# Patient Record
Sex: Male | Born: 1965
Health system: Southern US, Community
[De-identification: ages and names within clinical notes are randomized; demographics above are authoritative.]

## PROBLEM LIST (undated history)

## (undated) DIAGNOSIS — G4733 Obstructive sleep apnea (adult) (pediatric): Secondary | ICD-10-CM

## (undated) DIAGNOSIS — I10 Essential (primary) hypertension: Secondary | ICD-10-CM

## (undated) DIAGNOSIS — R12 Heartburn: Secondary | ICD-10-CM

## (undated) DIAGNOSIS — I2510 Atherosclerotic heart disease of native coronary artery without angina pectoris: Secondary | ICD-10-CM

## (undated) HISTORY — PX: OTHER SURGICAL HISTORY: SHX169

## (undated) HISTORY — DX: Obstructive sleep apnea (adult) (pediatric): G47.33

## (undated) HISTORY — DX: Atherosclerotic heart disease of native coronary artery without angina pectoris: I25.10

## (undated) HISTORY — DX: Heartburn: R12

---

## 2007-09-18 ENCOUNTER — Emergency Department: Payer: Self-pay | Admitting: Emergency Medicine

## 2008-05-02 ENCOUNTER — Ambulatory Visit: Payer: Self-pay | Admitting: Internal Medicine

## 2015-05-01 ENCOUNTER — Encounter: Payer: Self-pay | Admitting: Family Medicine

## 2015-05-01 ENCOUNTER — Ambulatory Visit (INDEPENDENT_AMBULATORY_CARE_PROVIDER_SITE_OTHER): Payer: BLUE CROSS/BLUE SHIELD | Admitting: Family Medicine

## 2015-05-01 VITALS — BP 120/83 | HR 80 | Temp 98.1°F | Resp 16 | Ht 74.0 in | Wt 223.2 lb

## 2015-05-01 DIAGNOSIS — R5382 Chronic fatigue, unspecified: Secondary | ICD-10-CM

## 2015-05-01 DIAGNOSIS — Z0000 Encounter for general adult medical examination without abnormal findings: Secondary | ICD-10-CM | POA: Diagnosis not present

## 2015-05-01 NOTE — Progress Notes (Signed)
Name: Howard Smith   MRN: [id number]    DOB: [birth year]   Date:05/01/2015       Progress Note  Subjective  Chief Complaint  Chief Complaint  Patient presents with  . Annual Exam    HPI Here for annual physical exam.  Has not been seen here in > 2 yrs. He had taken Testosterone in past, but none x > 1 yr.  Does not feel any worse off of it.  Still c/o some fatigue.  No problem-specific assessment & plan notes found for this encounter.   Past Medical History  Diagnosis Date  . Heartburn     Past Surgical History  Procedure Laterality Date  . None      Family History  Problem Relation Age of Onset  . Heart disease Father   . Cancer - Lung Father   . Bipolar disorder Mother     Social History   Social History  . Marital Status: Married    Spouse Name: N/A  . Number of Children: N/A  . Years of Education: N/A   Occupational History  . Not on file.   Social History Main Topics  . Smoking status: Never Smoker   . Smokeless tobacco: Never Used  . Alcohol Use: No  . Drug Use: No  . Sexual Activity: Not on file   Other Topics Concern  . Not on file   Social History Narrative    No current outpatient prescriptions on file.  No Known Allergies   Review of Systems  Constitutional: Positive for malaise/fatigue. Negative for fever, chills and weight loss.  HENT: Positive for hearing loss. Negative for congestion and ear pain.   Eyes: Negative for blurred vision and double vision.  Respiratory: Negative for cough, sputum production, shortness of breath and wheezing.   Cardiovascular: Negative for chest pain, palpitations, orthopnea and leg swelling.  Gastrointestinal: Negative for heartburn, abdominal pain and blood in stool.  Genitourinary: Negative for dysuria, urgency and frequency.  Musculoskeletal: Positive for joint pain (knees and ankles). Negative for myalgias.  Skin: Negative for rash.  Neurological: Negative for dizziness, tremors, sensory change,  focal weakness, weakness and headaches.  Psychiatric/Behavioral: Negative for depression. The patient is not nervous/anxious and does not have insomnia.       Objective  Filed Vitals:   05/01/15 1356  BP: 120/83  Pulse: 80  Temp: 98.1 F (36.7 C)  TempSrc: Oral  Resp: 16  Height:  (1.88 m)  Weight: 223 lb 3.2 oz (101.243 kg)    Physical Exam  Constitutional: He is well-developed, well-nourished, and in no distress. No distress.  HENT:  Head: Normocephalic and atraumatic.  Right Ear: External ear normal.  Left Ear: External ear normal.  Mouth/Throat: Oropharynx is clear and moist.  Eyes: Conjunctivae and EOM are normal. Pupils are equal, round, and reactive to light. Right eye exhibits no discharge. Left eye exhibits no discharge. No scleral icterus.  Fundoscopic exam:      The right eye shows no arteriolar narrowing, no AV nicking, no exudate and no hemorrhage.       The left eye shows no arteriolar narrowing, no AV nicking, no exudate and no hemorrhage.  Neck: Normal range of motion. Neck supple. Carotid bruit is not present. No thyromegaly present.  Cardiovascular: Normal rate, regular rhythm, normal heart sounds and intact distal pulses.  Exam reveals no gallop and no friction rub.   No murmur heard. Pulmonary/Chest: Effort normal and breath sounds normal. No respiratory distress.  He has no wheezes. He has no rales. He exhibits no tenderness.  Abdominal: Soft. Bowel sounds are normal. He exhibits no distension, no abdominal bruit and no mass. There is no tenderness.  Genitourinary: Prostate normal, testes/scrotum normal and penis normal.  Musculoskeletal: Normal range of motion. He exhibits no edema or tenderness.  Lymphadenopathy:    He has no cervical adenopathy.  Neurological: He is alert. No cranial nerve deficit. Gait normal. Coordination normal.  Skin: Skin is warm and dry.  Psychiatric: Mood, memory, affect and judgment normal.  Vitals reviewed.     No  results found for this or any previous visit (from the past 2160 hour(s)).   Assessment & Plan  Problem List Items Addressed This Visit    None    Visit Diagnoses    Annual physical exam    -  Primary    Relevant Orders    Lipid Profile    Chronic fatigue        Relevant Orders    Comprehensive Metabolic Panel (CMET)    CBC with Differential    Vitamin D 1,25 dihydroxy    TSH       No orders of the defined types were placed in this encounter.   1. Annual physical exam  - Lipid Profile  2. Chronic fatigue  - Comprehensive Metabolic Panel (CMET) - CBC with Differential - Vitamin D 1,25 dihydroxy - TSH

## 2015-05-01 NOTE — Patient Instructions (Signed)
To get flu shot at work later this month.

## 2015-05-02 LAB — CBC WITH DIFFERENTIAL/PLATELET
BASOS: 0 %
Basophils Absolute: 0 10*3/uL (ref 0.0–0.2)
EOS (ABSOLUTE): 0.1 10*3/uL (ref 0.0–0.4)
EOS: 2 %
HEMATOCRIT: 43.9 % (ref 37.5–51.0)
HEMOGLOBIN: 15.7 g/dL (ref 12.6–17.7)
IMMATURE GRANS (ABS): 0 10*3/uL (ref 0.0–0.1)
IMMATURE GRANULOCYTES: 0 %
LYMPHS: 40 %
Lymphocytes Absolute: 2.2 10*3/uL (ref 0.7–3.1)
MCH: 29.9 pg (ref 26.6–33.0)
MCHC: 35.8 g/dL — ABNORMAL HIGH (ref 31.5–35.7)
MCV: 84 fL (ref 79–97)
MONOCYTES: 6 %
Monocytes Absolute: 0.3 10*3/uL (ref 0.1–0.9)
NEUTROS ABS: 2.8 10*3/uL (ref 1.4–7.0)
Neutrophils: 52 %
Platelets: 227 10*3/uL (ref 150–379)
RBC: 5.25 x10E6/uL (ref 4.14–5.80)
RDW: 13.5 % (ref 12.3–15.4)
WBC: 5.5 10*3/uL (ref 3.4–10.8)

## 2015-05-03 LAB — LIPID PANEL
CHOLESTEROL TOTAL: 168 mg/dL (ref 100–199)
Chol/HDL Ratio: 3.9 ratio units (ref 0.0–5.0)
HDL: 43 mg/dL (ref >39)
LDL Calculated: 110 mg/dL — ABNORMAL HIGH (ref 0–99)
Triglycerides: 74 mg/dL (ref 0–149)
VLDL CHOLESTEROL CAL: 15 mg/dL (ref 5–40)

## 2015-05-03 LAB — COMPREHENSIVE METABOLIC PANEL
ALBUMIN: 4.4 g/dL (ref 3.5–5.5)
ALK PHOS: 86 IU/L (ref 39–117)
ALT: 30 IU/L (ref 0–44)
AST: 23 IU/L (ref 0–40)
Albumin/Globulin Ratio: 1.7 (ref 1.1–2.5)
BILIRUBIN TOTAL: 0.5 mg/dL (ref 0.0–1.2)
BUN / CREAT RATIO: 15 (ref 9–20)
BUN: 15 mg/dL (ref 6–24)
CHLORIDE: 104 mmol/L (ref 97–108)
CO2: 22 mmol/L (ref 18–29)
Calcium: 9.6 mg/dL (ref 8.7–10.2)
Creatinine, Ser: 1.02 mg/dL (ref 0.76–1.27)
GFR calc Af Amer: 101 mL/min/{1.73_m2} (ref >59)
GFR calc non Af Amer: 87 mL/min/{1.73_m2} (ref >59)
GLUCOSE: 92 mg/dL (ref 65–99)
Globulin, Total: 2.6 g/dL (ref 1.5–4.5)
POTASSIUM: 4.4 mmol/L (ref 3.5–5.2)
Sodium: 139 mmol/L (ref 134–144)
Total Protein: 7 g/dL (ref 6.0–8.5)

## 2015-05-03 LAB — TSH: TSH: 1.23 u[IU]/mL (ref 0.450–4.500)

## 2015-05-06 LAB — VITAMIN D 1,25 DIHYDROXY
VITAMIN D 1, 25 (OH) TOTAL: 59 pg/mL
Vitamin D2 1, 25 (OH)2: <10 pg/mL
Vitamin D3 1, 25 (OH)2: 58 pg/mL

## 2015-05-07 NOTE — Progress Notes (Signed)
MAILED

## 2015-10-09 ENCOUNTER — Ambulatory Visit (INDEPENDENT_AMBULATORY_CARE_PROVIDER_SITE_OTHER): Payer: BLUE CROSS/BLUE SHIELD | Admitting: Family Medicine

## 2015-10-09 ENCOUNTER — Other Ambulatory Visit: Payer: Self-pay

## 2015-10-09 ENCOUNTER — Encounter: Payer: Self-pay | Admitting: Family Medicine

## 2015-10-09 VITALS — BP 113/70 | HR 96 | Resp 16 | Ht 73.0 in | Wt 227.4 lb

## 2015-10-09 DIAGNOSIS — N451 Epididymitis: Secondary | ICD-10-CM

## 2015-10-09 DIAGNOSIS — N410 Acute prostatitis: Secondary | ICD-10-CM | POA: Diagnosis not present

## 2015-10-09 MED ORDER — CIPROFLOXACIN HCL 500 MG PO TABS: 500.0000 mg | ORAL_TABLET | Freq: Two times a day (BID) | ORAL | Status: AC

## 2015-10-09 NOTE — Progress Notes (Signed)
Name: Howard Smith   MRN: [id number]    DOB: 09-12-1965   Date:10/09/2015       Progress Note  Subjective  Chief Complaint  Chief Complaint  Patient presents with  . Fatigue    very weak not sleeping -6 month  . Urinary Frequency    wakes up every 5 min to use bathroom all night -6 months    HPI C/o urinary sx. Of frequency,, urgency, nocturia (x2-3).  Sx. Present x 4-5 months.  Having some ED probvems for past 3 months.  No dysuria or penile discharge.  No problem-specific assessment & plan notes found for this encounter.   Past Medical History  Diagnosis Date  . Heartburn     Past Surgical History  Procedure Laterality Date  . None      Family History  Problem Relation Age of Onset  . Heart disease Father   . Cancer - Lung Father   . Bipolar disorder Mother     Social History   Social History  . Marital Status: Married    Spouse Name: N/A  . Number of Children: N/A  . Years of Education: N/A   Occupational History  . Not on file.   Social History Main Topics  . Smoking status: Never Smoker   . Smokeless tobacco: Never Used  . Alcohol Use: No  . Drug Use: No  . Sexual Activity: Not on file   Other Topics Concern  . Not on file   Social History Narrative     Current outpatient prescriptions:  .  ciprofloxacin (CIPRO) 500 MG tablet, Take 1 tablet (500 mg total) by mouth 2 (two) times daily., Disp: 28 tablet, Rfl: 0  No Known Allergies   Review of Systems  Constitutional: Positive for malaise/fatigue. Negative for fever, chills and weight loss.  HENT: Negative for hearing loss.   Eyes: Negative for blurred vision and double vision.  Respiratory: Negative for cough, shortness of breath and wheezing.   Cardiovascular: Negative for chest pain, palpitations and orthopnea.  Gastrointestinal: Negative for heartburn, abdominal pain and blood in stool.  Genitourinary: Positive for urgency and frequency. Negative for dysuria.  Musculoskeletal: Negative  for myalgias.  Skin: Negative for rash.  Neurological: Negative for weakness and headaches.      Objective  Filed Vitals:   10/09/15 1613  BP: 113/70  Pulse: 96  Resp: 16  Height:  (1.854 m)  Weight: 227 lb 6.4 oz (103.148 kg)    Physical Exam  Constitutional: He is oriented to person, place, and time and well-developed, well-nourished, and in no distress. No distress.  HENT:  Head: Normocephalic and atraumatic.  Abdominal: Soft. Bowel sounds are normal. He exhibits no distension and no mass. There is no tenderness.  Genitourinary: Penis normal.  R epididymitis is sl. Tender.   Prostate is soft and boggy and moderately tender  Neurological: He is alert and oriented to person, place, and time.  Vitals reviewed.      No results found for this or any previous visit (from the past 2160 hour(s)).   Assessment & Plan  Problem List Items Addressed This Visit    None    Visit Diagnoses    Prostatitis, acute    -  Primary    Relevant Medications    ciprofloxacin (CIPRO) 500 MG tablet    Epididymitis, right        Relevant Medications    ciprofloxacin (CIPRO) 500 MG tablet  Meds ordered this encounter  Medications  . ciprofloxacin (CIPRO) 500 MG tablet    Sig: Take 1 tablet (500 mg total) by mouth 2 (two) times daily.    Dispense:  28 tablet    Refill:  0   1. Prostatitis, acute  - ciprofloxacin (CIPRO) 500 MG tablet; Take 1 tablet (500 mg total) by mouth 2 (two) times daily.  Dispense: 28 tablet; Refill: 0  2. Epididymitis, right  - ciprofloxacin (CIPRO) 500 MG tablet; Take 1 tablet (500 mg total) by mouth 2 (two) times daily.  Dispense: 28 tablet; Refill: 0

## 2016-03-04 ENCOUNTER — Emergency Department: Payer: BLUE CROSS/BLUE SHIELD

## 2016-03-04 ENCOUNTER — Emergency Department
Admission: EM | Admit: 2016-03-04 | Discharge: 2016-03-04 | Disposition: A | Discharge status: Home / Self Care | Payer: BLUE CROSS/BLUE SHIELD | Attending: Emergency Medicine | Admitting: Emergency Medicine

## 2016-03-04 DIAGNOSIS — S161XXA Strain of muscle, fascia and tendon at neck level, initial encounter: Secondary | ICD-10-CM | POA: Diagnosis not present

## 2016-03-04 DIAGNOSIS — S8001XA Contusion of right knee, initial encounter: Secondary | ICD-10-CM | POA: Diagnosis not present

## 2016-03-04 DIAGNOSIS — M542 Cervicalgia: Secondary | ICD-10-CM | POA: Diagnosis present

## 2016-03-04 DIAGNOSIS — Y9389 Activity, other specified: Secondary | ICD-10-CM | POA: Insufficient documentation

## 2016-03-04 DIAGNOSIS — Y92410 Unspecified street and highway as the place of occurrence of the external cause: Secondary | ICD-10-CM | POA: Insufficient documentation

## 2016-03-04 DIAGNOSIS — Y999 Unspecified external cause status: Secondary | ICD-10-CM | POA: Insufficient documentation

## 2016-03-04 MED ORDER — ONDANSETRON 4 MG PO TBDP
ORAL_TABLET | ORAL | Status: AC
  Filled 2016-03-04: qty 1

## 2016-03-04 MED ORDER — CYCLOBENZAPRINE HCL 10 MG PO TABS: 10.0000 mg | ORAL_TABLET | Freq: Three times a day (TID) | ORAL | 0 refills | Status: DC | PRN

## 2016-03-04 MED ORDER — ONDANSETRON HCL 4 MG PO TABS
4.0000 mg | ORAL_TABLET | Freq: Once | ORAL | Status: AC
  Administered 2016-03-04: 4 mg via ORAL

## 2016-03-04 MED ORDER — ONDANSETRON 4 MG PO TBDP
4.0000 mg | ORAL_TABLET | Freq: Once | ORAL | Status: AC
  Administered 2016-03-04: 4 mg via ORAL

## 2016-03-04 MED ORDER — NAPROXEN 500 MG PO TABS: 500.0000 mg | ORAL_TABLET | Freq: Two times a day (BID) | ORAL | 0 refills | Status: DC
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
## 2016-06-26 ENCOUNTER — Ambulatory Visit
Admission: RE | Admit: 2016-06-26 | Discharge: 2016-06-26 | Disposition: A | Discharge status: Home / Self Care | Payer: BLUE CROSS/BLUE SHIELD | Source: Ambulatory Visit | Attending: Family Medicine | Admitting: Family Medicine

## 2016-06-26 ENCOUNTER — Encounter: Payer: Self-pay | Admitting: Family Medicine

## 2016-06-26 ENCOUNTER — Ambulatory Visit (INDEPENDENT_AMBULATORY_CARE_PROVIDER_SITE_OTHER): Payer: BLUE CROSS/BLUE SHIELD | Admitting: Family Medicine

## 2016-06-26 VITALS — BP 128/89 | HR 79 | Temp 98.3°F | Resp 16 | Ht 73.0 in | Wt 232.0 lb

## 2016-06-26 DIAGNOSIS — R10A1 Flank pain, right side: Secondary | ICD-10-CM | POA: Insufficient documentation

## 2016-06-26 DIAGNOSIS — R319 Hematuria, unspecified: Secondary | ICD-10-CM

## 2016-06-26 DIAGNOSIS — R42 Dizziness and giddiness: Secondary | ICD-10-CM | POA: Diagnosis not present

## 2016-06-26 DIAGNOSIS — R109 Unspecified abdominal pain: Secondary | ICD-10-CM

## 2016-06-26 LAB — POCT UA - MICROALBUMIN: MICROALBUMIN (UR) POC: 0 mg/L

## 2016-06-26 LAB — POCT URINALYSIS DIPSTICK
Bilirubin, UA: NEGATIVE
Glucose, UA: NEGATIVE
Ketones, UA: NEGATIVE
Leukocytes, UA: NEGATIVE
Nitrite, UA: NEGATIVE
RBC UA: NEGATIVE
SPEC GRAV UA: 1.01
UROBILINOGEN UA: 0.2
pH, UA: 5

## 2016-06-26 MED ORDER — CYCLOBENZAPRINE HCL 10 MG PO TABS: 5.0000 mg | ORAL_TABLET | Freq: Three times a day (TID) | ORAL | 0 refills | Status: DC | PRN

## 2016-06-26 MED ORDER — TAMSULOSIN HCL 0.4 MG PO CAPS: 0.4000 mg | ORAL_CAPSULE | Freq: Every day | ORAL | 0 refills | Status: DC

## 2016-06-26 NOTE — Progress Notes (Signed)
Subjective:    Patient ID: Howard Smith, male    DOB: 16-Feb-1966, 50 y.o.   MRN: [id number]  Howard Smith is a 50 y.o. 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 Today seems to be about the same and persistent. Describes pain as throbbing, severity 5/10 (constantly without relief) but if moving with standing up 8/10 with intermittent worsening with sharp pain radiating to front and light squeezing pressure on Right testicle / scrotum. No pain radiating to legs. - Tried chiropractor, massage without relief - Taking Ibuprofen 800mg  x twice a day without any relief. Tried topical biofreeze and heating pad without relief - No prior history of lumbar OA/DJD, no back surgery. No prior hernia. - No similar painful episodes - Drinks mostly soda, less water - Admits some urinary frequency and occasional darker urine with some slight orange color (sometimes clear urine) - Denies any fevers/chills, loss of control bladder/bowel incontinence or retention, unintentional wt loss, night sweats  Dizziness / Vertigo Episodes: - Reports symptoms 3 weeks ago, x 2 episodes in past 7 months. Describes acute onset symptoms while working, without known trigger. Unsure if related to activity or head position, standing up. - Associated generalized headache severe, worse with opening eyes and standing up with nausea, vomiting - Admits vertigo symptoms with  room spinning - Today without any symptoms - Neighbor had similar symptoms diagnosed with mild stroke with similar symptoms - Family history of CVA on both maternal / paternal and DM. Patient without HTN or DM. - Not taking aspirin - Denies any active symptoms, nausea, vomiting, weakness, numbness, or tingling, headache  Social History  Substance Use Topics  . Smoking status: Never Smoker  . Smokeless tobacco: Never Used  . Alcohol use No    Review of Systems Per HPI unless specifically indicated above     Objective:    BP 128/89   Pulse 79   Temp 98.3 F (36.8 C) (Oral)   Resp 16   Ht 6\' 1"  (1.854 m)   Wt 232 lb (105.2 kg)   BMI 30.61 kg/m   Wt Readings from Last 3 Encounters:  06/26/16 232 lb (105.2 kg)  03/04/16 225 lb (102.1 kg)  10/09/15 227 lb 6.4 oz (103.1 kg)    Physical Exam  Constitutional: He is oriented to person, place, and time. He appears well-developed and well-nourished. No distress.  HENT:  Head: Normocephalic and atraumatic.  Mouth/Throat: Oropharynx is clear and moist.  Eyes: Conjunctivae and EOM are normal. Pupils are equal, round, and reactive to light.  Neck: Normal range of motion. Neck supple. No thyromegaly present.  Cardiovascular: Normal rate, regular rhythm, normal heart sounds and intact distal pulses.   No murmur heard. Pulmonary/Chest: Effort normal and breath sounds normal. No respiratory distress. He has no wheezes. He has no rales.  Abdominal: Soft. Bowel sounds are normal. He exhibits no distension and  no mass. There is no tenderness.  Genitourinary:  Genitourinary Comments: Right inguinal region mild tender to touch. Slight fullness R inguinal area compared to Left, no palpable herniation on internal inguinal exam with valsalva.  Scrotum non tender, no swelling, no erythema.  Musculoskeletal: Normal range of motion. He exhibits no edema or tenderness.  +CVAT Right  Low Back Inspection: Normal appearance, no spinal deformity,  symmetrical. Palpation: No tenderness over spinous processes. Mild moderate tenderness to Right lower lumbar paraspinal muscles with some localized spasm ROM: Full active ROM forward flex / back extension, rotation L/R without discomfort Special Testing: Seated SLR negative for radicular pain bilaterally  Strength: Bilateral hip flex/ext 5/5, knee flex/ext 5/5, ankle dorsiflex/plantarflex 5/5 Neurovascular: intact distal sensation to light touch  Lymphadenopathy:    He has no cervical adenopathy.  Neurological: He is alert and oriented to person, place, and time.  Negative Dix Hallpike maneuver bilateral. Slight lightheaded upon sitting up quickly.  Distal sensation to light touch intact  Skin: Skin is warm and dry. No rash noted. He is not diaphoretic.  Psychiatric: He has a normal mood and affect. His behavior is normal.  Nursing note and vitals reviewed.  I have personally reviewed the images and radiology report from [facility] on 06/26/16. - My personal interpretation is that I do not identify any abnormal calcification that possibly represents a nephrolithiasis as cause of current abdominal flank pain.  CLINICAL DATA:  Right side abdominal pain for 2 weeks  EXAM: ABDOMEN - 1 VIEW  COMPARISON:  None.  FINDINGS: There is normal small bowel gas pattern. Moderate stool noted in right colon. Some colonic stool noted in distal sigmoid colon and rectum.  IMPRESSION: Normal small bowel gas pattern. Moderate stool noted in right colon.   Electronically Signed   By: Natasha MeadLiviu  Pop M.D.   On: 06/26/2016 15:12    I have personally reviewed the following lab results from 06/26/16.  Results for orders placed or performed in visit on 06/26/16  POCT UA - Microalbumin  Result Value Ref Range   Microalbumin Ur, POC 0 mg/L   Creatinine, POC  mg/dL   Albumin/Creatinine Ratio, Urine, POC    POCT Urinalysis Dipstick  Result Value Ref Range   Color, UA amber    Clarity, UA  cloudy    Glucose, UA negative    Bilirubin, UA negative    Ketones, UA negative    Spec Grav, UA 1.010    Blood, UA negative    pH, UA 5.0    Protein, UA trace    Urobilinogen, UA 0.2    Nitrite, UA negative    Leukocytes, UA Negative Negative      Assessment & Plan:   Problem List Items Addressed This Visit    Vertigo, intermittent    Unclear exact etiology, by history seems more vertiginous in nature but not entirely consistent with BPPV, exam with negative Dix-Hallpike less suggestive but not completely rule out a benign vertigo. No other focal signs or history to suggest TIA, but family history of this and no risk factors currently. Unlikely other serious intracranial abnormality without persistent headache or other red flags, unlikely aneurysm or malignancy.  Plan: 1. Empiric trial with Epley. Handout given with Epley maneuver TID for 1-2 weeks until resolved 2. May try OTC meclizine PRN for breakthrough symptoms 3. Return criteria, if not improved consider ENT eval for dizziness vs vestibular PT referral. If significant worsening can consider head CT imaging  Acute right flank pain - Primary    Unclear exact etiology currently, with subacute worsening R low back to flank pain radiating into scrotum without clear injury, but does active lifting at work. Not improved with conservative therapy. - Differential most likely nephrolithiasis by history and exam (+CVAT, amber urine color change possible hematuria, some frequency, radiating pain into scrotum), considered UTI vs pyelo but not acutely ill and no dysuria, also strongly considered inguinal hernia, exam is not diagnostic but does have some R inguinal fullness, possibility, also consider MSK low back muscle / groin strain, description of pain less likely  Plan: 1. Check UA and Urine culture - UA negative, without Hgb or blood, no sign of infection, will await urine culture as well to be sure rule out UTI 2. Check KUB today  in office - no nephrolithiasis seen by my review and per radiology, noted R stool burden, but patient not constipated. Called patient to notify of x-ray, lab results 3. Trial on Flomax 0.4mg  7-10 days 4. Rx Flexeril PRN muscle spasms and may help ureter spasm if actual nephrolithiasis, patient declined stronger pain medicine currently and anti nausea med 5. Improve hydration with water, limit sodas 6. Strict return precautions given, follow-up within 1 week if not improving, consider Urology evaluation vs Gen surgery if concern hernia      Relevant Medications   cyclobenzaprine (FLEXERIL) 10 MG tablet   tamsulosin (FLOMAX) 0.4 MG CAPS capsule   Other Relevant Orders   DG Abd 1 View (Completed)   POCT Urinalysis Dipstick (Completed)    Other Visit Diagnoses    Hematuria, unspecified type       Relevant Orders   DG Abd 1 View (Completed)   POCT UA - Microalbumin (Completed)   Urine culture   POCT Urinalysis Dipstick (Completed)      Meds ordered this encounter  Medications  . cyclobenzaprine (FLEXERIL) 10 MG tablet    Sig: Take 0.5-1 tablets (5-10 mg total) by mouth 3 (three) times daily as needed for muscle spasms.    Dispense:  20 tablet    Refill:  0  . tamsulosin (FLOMAX) 0.4 MG CAPS capsule    Sig: Take 1 capsule (0.4 mg total) by mouth daily.    Dispense:  10 capsule    Refill:  0      Follow up plan: Return in about 1 week (around 07/03/2016), or if symptoms worsen or fail to improve, for flank pain.   Saralyn Pilar, DO [facility] 06/27/2016, 10:40 AM

## 2016-06-26 NOTE — Patient Instructions (Signed)
Thank you for coming in to clinic today.  1. I am concerned about possible R kidney stone with the pain you are describing. Additionally this could be a muscle strain, but seems less likely. Or possibly an early hernia that radiates pain into your groin and scrotum. I do not feel a hernia, but I do fee a slight bulging in this tissue and this is possible. - Testing today for kidney stone with urine test and urine culture, possibly there is an infection - X-ray to look for a stone, this is not 100% and often misses many stones, but it is first attempt to locate one - Take flomax once daily for 7 to 10 days to help urinate and pass a possible stone  Start Cyclobenzapine (Flexeril) 10mg  tablets (muscle relaxant) - start with half (cut) to one whole pill at night for muscle relaxant - may make you sedated or sleepy (be careful driving or working on this) if tolerated you can take half to whole tab 2 to 3 times daily or every 8 hours as needed  Continue ibuprofen, and may take Tylenol as needed as well if this helps.  Stay well hydrated, more water and less soda or tea.  We will send you results through mychart or call sooner if we need to change treatment.  If pain is not improving over next 1-2 weeks, or new symptoms [facility] for [facility] of possible kidney stone or other cause, may need CT scan next. Or we can arrange you to be seen by Urologist.  If pain is worse with lifting and you feel a bulging and concern for hernia, then we can arrange follow-up with General Surgery  For Dizziness episodes, as discussed likely a vertigo, possibly from inner ear. I do not have concerns today for stroke or TIA. - You may take baby aspirin 81mg  daily for prevention if you are concerned, with family history of stroke - Try to keep track of symptoms, be aware of sudden onset with head movements, dietary triggers, positional or other factors - Maybe related to new migraines or other  causes, if headaches worsening or other changes follow-up sooner, or seek more immediate treatment  Try Epley maneuver to help prevent future problems.  Or you can try Meclizine 25mg  up to 3 times a day as needed for nausea and dizziness  Please schedule a follow-up appointment with Dr. Althea CharonKaramalegos in 1 week if not improved  If you have any other questions or concerns, please feel free to call the clinic or send a message through MyChart. You may also schedule an earlier appointment if necessary.  Saralyn PilarAlexander Hendy Brindle, DO [facility]  1. You have symptoms of Vertigo (Benign Paroxysmal Positional Vertigo) - This is commonly caused by inner ear fluid imbalance, sometimes can be worsened by allergies and sinus symptoms, otherwise it can occur randomly sometimes and we may never discover the exact cause. - To treat this, try the Epley Manuever (see diagrams/instructions below) at home up to 3 times a day for 1-2 weeks or until symptoms resolve - You may take Meclizine as needed up to 3 times a day for dizziness, this will not cure symptoms but may help. Caution may make you drowsy.  If you develop significant worsening episode with vertigo that does not improve and you get severe headache, loss of vision, arm or leg weakness, slurred speech, or other concerning symptoms please seek immediate medical attention at Emergency Department.  Please schedule a follow-up appointment with Dr Althea CharonKaramalegos within 4 weeks if Vertigo not improving, and will consider Referral to Vestibular Rehab  See the next page for images describing the Epley Manuever.     ----------------------------------------------------------------------------------------------------------------------

## 2016-06-27 NOTE — Assessment & Plan Note (Signed)
Unclear exact etiology currently, with subacute worsening R low back to flank pain radiating into scrotum without clear injury, but does active lifting at work. Not improved with conservative therapy. - Differential most likely nephrolithiasis by history and exam (+CVAT, amber urine color change possible hematuria, some frequency, radiating pain into scrotum), considered UTI vs pyelo but not acutely ill and no dysuria, also strongly considered inguinal hernia, exam is not diagnostic but does have some R inguinal fullness, possibility, also consider MSK low back muscle / groin strain, description of pain less likely  Plan: 1. Check UA and Urine culture - UA negative, without Hgb or blood, no sign of infection, will await urine culture as well to be sure rule out UTI 2. Check KUB today in office - no nephrolithiasis seen by my review and per radiology, noted R stool burden, but patient not constipated. Called patient to notify of x-ray, lab results 3. Trial on Flomax 0.4mg  7-10 days 4. Rx Flexeril PRN muscle spasms and may help ureter spasm if actual nephrolithiasis, patient declined stronger pain medicine currently and anti nausea med 5. Improve hydration with water, limit sodas 6. Strict return precautions given, follow-up within 1 week if not improving, consider Urology evaluation vs Gen surgery if concern hernia

## 2016-06-27 NOTE — Assessment & Plan Note (Addendum)
Unclear exact etiology, by history seems more vertiginous in nature but not entirely consistent with BPPV, exam with negative Dix-Hallpike less suggestive but not completely rule out a benign vertigo. No other focal signs or history to suggest TIA, but family history of this and no risk factors currently. Unlikely other serious intracranial abnormality without persistent headache or other red flags, unlikely aneurysm or malignancy.  Plan: 1. Empiric trial with Epley. Handout given with Epley maneuver TID for 1-2 weeks until resolved 2. May try OTC meclizine PRN for breakthrough symptoms 3. Return criteria, if not improved consider ENT eval for dizziness vs vestibular PT referral. If significant worsening can consider head CT imaging

## 2016-06-28 LAB — URINE CULTURE: Organism ID, Bacteria: NO GROWTH

## 2016-10-14 ENCOUNTER — Ambulatory Visit (INDEPENDENT_AMBULATORY_CARE_PROVIDER_SITE_OTHER): Payer: BLUE CROSS/BLUE SHIELD | Admitting: Family Medicine

## 2016-10-14 ENCOUNTER — Encounter: Payer: Self-pay | Admitting: Family Medicine

## 2016-10-14 VITALS — BP 128/94 | HR 86 | Temp 98.6°F | Resp 16 | Ht 73.0 in | Wt 234.0 lb

## 2016-10-14 DIAGNOSIS — R030 Elevated blood-pressure reading, without diagnosis of hypertension: Secondary | ICD-10-CM | POA: Diagnosis not present

## 2016-10-14 DIAGNOSIS — L0201 Cutaneous abscess of face: Secondary | ICD-10-CM

## 2016-10-14 DIAGNOSIS — S76311A Strain of muscle, fascia and tendon of the posterior muscle group at thigh level, right thigh, initial encounter: Secondary | ICD-10-CM | POA: Diagnosis not present

## 2016-10-14 DIAGNOSIS — S76319A Strain of muscle, fascia and tendon of the posterior muscle group at thigh level, unspecified thigh, initial encounter: Secondary | ICD-10-CM | POA: Insufficient documentation

## 2016-10-14 DIAGNOSIS — I10 Essential (primary) hypertension: Secondary | ICD-10-CM | POA: Insufficient documentation

## 2016-10-14 MED ORDER — DOXYCYCLINE HYCLATE 100 MG PO CAPS: 100.0000 mg | ORAL_CAPSULE | Freq: Two times a day (BID) | ORAL | 0 refills | Status: DC

## 2016-10-14 MED ORDER — NAPROXEN 500 MG PO TABS: 500.0000 mg | ORAL_TABLET | Freq: Two times a day (BID) | ORAL | 2 refills | Status: DC

## 2016-10-14 MED ORDER — CYCLOBENZAPRINE HCL 10 MG PO TABS: 5.0000 mg | ORAL_TABLET | Freq: Three times a day (TID) | ORAL | 1 refills | Status: DC | PRN

## 2016-10-14 MED ORDER — AMOXICILLIN-POT CLAVULANATE 875-125 MG PO TABS: 1.0000 | ORAL_TABLET | Freq: Two times a day (BID) | ORAL | 0 refills | Status: DC

## 2016-10-14 NOTE — Progress Notes (Signed)
Subjective:    Patient ID: Howard Smith, male    DOB: 21-Jan-1966, 51 y.o.   MRN: [id number]  Howard Smith is a 51 y.o. male presenting on 10/14/2016 for Hypertension (obtw facial swelling on Right side onset 2 days and pulled muscle)   HPI   Elevated BP without dx of HTN: Reports never diagnosed with HTN in past. Has had normal BP when at [employer] physical in past and other doctors visits. Does not recall even borderline elevated BP. Now here today to discuss elevated BPyesterday at DOT physical, 145/101, then repeat after laying down 142/108, would not complete due to HTN. - No known family history of HTN. Has Father with Heart Disease MI >60+ Current Meds - none, never on anti-HTN   Associated Factors: - No significant life stressors currently, has some average family stress, - Significantly did hurt his Right leg / hamstring over past weekend, and has had pain with that, taking several Ibuprofen up to 200mg  taking 5 tabs for 1000mg  per one dose. - Drinks diet mountain dew, 3-4 of 20 oz daily. Not - Trying to exercise regularly at gym in morning before work, occasionally canceled and missed days, 5 days weekly Denies CP, dyspnea, HA, edema, dizziness / lightheadedness  Facial Abscess, Right New problem, reported onset 2 days ago, he did shave but did not notice any scratch or facial injury. He noticed localized spot of redness and swelling, did drain minimal pus but mostly bleeding. He used warm compress with some relief, but now worsening swelling and pain extending down jaw. - No similar episodes of abscess before - Denies any fever, chills, sweats, nausea, vomiting  Right Leg Pain / Hamstring Injury - Reports he started playing softball this past weekend for first time in 22 years, he was running bases and when rounding corner with a turn and twist he felt acute pain behind his Right knee, but he was able to keep running did not fall or have traumatic injury. He had pain and did not play  rest of game but still able to ambulate. Denies hearing a pop or feeling any other abnormality at time of injury - He identified darker bruising behind Right knee only, but pain higher up on thigh hamstring. Over past few days he has been stretching leg. Pain is not constant but worst with activity and extension - Taking regular Ibuprofen up to 4-5 pills 200mg  per dose 2-3 times daily, minimal relief, tried Tylenol able to sleep last night - Denies knee swelling, other bruising injury, swelling, numbness, tingling, weakness, fall   Family History  Problem Relation Age of Onset  . Heart disease Father   . Cancer - Lung Father   . Bipolar disorder Mother     Social History  Substance Use Topics  . Smoking status: Never Smoker  . Smokeless tobacco: Never Used  . Alcohol use No    Review of Systems Per HPI unless specifically indicated above     Objective:    BP (!) 128/94 (BP Location: Left Arm, Cuff Size: Normal)   Pulse 86   Temp 98.6 F (37 C) (Oral)   Resp 16   Ht 6\' 1"  (1.854 m)   Wt 234 lb (106.1 kg)   BMI 30.87 kg/m   Wt Readings from Last 3 Encounters:  10/14/16 234 lb (106.1 kg)  06/26/16 232 lb (105.2 kg)  03/04/16 225 lb (102.1 kg)    Physical Exam  Constitutional: He is oriented to person, place,  and time. He appears well-developed and well-nourished. No distress.  Well-appearing, comfortable, cooperative  HENT:  Head: Normocephalic and atraumatic.  Mouth/Throat: Oropharynx is clear and moist.  Eyes: Conjunctivae are normal.  Neck: Normal range of motion. Neck supple.  Cardiovascular: Normal rate, regular rhythm, normal heart sounds and intact distal pulses.   No murmur heard. Pulmonary/Chest: Effort normal.  Musculoskeletal: Normal range of motion.  Right Knee / Hamstring Lower Extremity Inspection: Normal appearance and symmetrical. No ecchymosis or effusion. - Posteriorly above R knee with localized area of ecchymosis darker with surrounding  yellowing Palpation: Non-tender over ecchymosis posterior knee, but mild to moderate tenderness over higher R hamstring muscle bodies with some palpable hypertonicity and spasm. - R knee anteriorly non tender joint line. No crepitus ROM: Full active ROM bilaterally - some mild discomfort and pain with full R knee extension but not limited Special Testing: Lachman / Valgus/Varus tests negative with intact ligaments (ACL, MCL, LCL). McMurray negative without meniscus symptoms. Strength: 5/5 intact knee flex/ext, ankle dorsi/plantarflex Neurovascular: distally intact sensation light touch and pulses  Lymphadenopathy:    He has no cervical adenopathy.  Neurological: He is alert and oriented to person, place, and time.  Distal sensation intact  Gait normal  Skin: Skin is warm and dry. No rash noted. He is not diaphoretic. There is erythema.  Right facial cheek area with scab and localized firm induration approx 2-3 cm region surrounding. No fluctuance. No drainage. Mild to moderate tender extending over affected spot and superiorly over angle of mandible and down jaw into neck without extending erythema.  Psychiatric: His behavior is normal.  Nursing note and vitals reviewed.    Right Face    Right Leg         Assessment & Plan:   Problem List Items Addressed This Visit    Partial hamstring tear    Suspected partial hamstring tear, acute R posterior thigh hamstring pain with localized bruising posterior R knee, inciting injury 3 days ago with softball injury while running and twisting (turning corner) non traumatic injury, without audible or sensation of pop, remained ambulatory - Gradually improving, still painful, muscle spasm. Without knee or hip symptoms. - Able to bear weight without significant problem, no knee instability - No prior history of Leg injury - Not responding to conservative therapy with NSAID only  Plan: 1. Start anti-inflammatory trial with rx Naprosyn 500mg   BID wc x 2-4 weeks, then PRN 2. Start Tylenol 500-1000mg  per dose TID PRN breakthrough - Start muscle relaxant with Flexeril 10mg  tabs - take 5-10mg  up to TID PRN, titrate up as tolerated 3. RICE therapy (rest, ice, compression, elevation) for swelling, activity modification 4. Hold imaging today - most benefit likely from [facility] ultrasound in future to evaluate partial muscle tear 5. Gradual increase activity as tolerated, let pain be guide 6. Follow-up 6 weeks as planned - may need guided PT regimen      Relevant Medications   cyclobenzaprine (FLEXERIL) 10 MG tablet   naproxen (NAPROSYN) 500 MG tablet   Facial abscess - Primary    Consistent with new acute R facial cheek abscess with firm induration with some surrounding erythema but remains localized without clear cellulitis or any systemic symptoms. Concern with some extension of pain over jawline. - No history of recurrent abcesses in this or other areas, not consistent with chronic suppurativa hidradenitis. No recent antibiotics.  Plan: 1. Given location and lack of fluctuance - decision to defer I&D today 2. Start double antibiotic  coverage with Augmentin and Doxcycyline BID for 10 days - potential side effects, precautions given 3. Warm compresses regularly 4. Naproxen for leg can help as NSAID, Tylenol PRN 5. Strict return precautions given if not improving, see AVS - when to go to [facility] ED 6. Follow-up sooner 1-2 weeks as needed      Relevant Medications   amoxicillin-clavulanate (AUGMENTIN) 875-125 MG tablet   doxycycline (VIBRAMYCIN) 100 MG capsule   Elevated BP without diagnosis of hypertension    Mild elevated BP initially today, repeat manual improved SBP but still elevated DBP >90. No prior dx HTN or Pre-HTN, but at [employer] physical yesterday had persistent elevated BP >140/100 - No fam history of HTN. No prior meds  Plan: 1. Reassurance today, suspect other factors raising BP, with acute R hamstring leg injury, facial  abscess infection - treat these underlying problems first 2. Hold new dx HTN and new start anti-HTN med at this time 3. Check home BP regularly, given handout BP log - check daily 4. Follow-up 6 weeks for re-check BP, if elevated still at that time and home readings elevated, will consider new dx and new start thiazide, ccb, or ACEi         Meds ordered this encounter  Medications  . amoxicillin-clavulanate (AUGMENTIN) 875-125 MG tablet    Sig: Take 1 tablet by mouth 2 (two) times daily. For 10 days    Dispense:  20 tablet    Refill:  0  . doxycycline (VIBRAMYCIN) 100 MG capsule    Sig: Take 1 capsule (100 mg total) by mouth 2 (two) times daily. For 10 days. Take with full glass water and stay upright for 30 min after    Dispense:  20 capsule    Refill:  0  . cyclobenzaprine (FLEXERIL) 10 MG tablet    Sig: Take 0.5-1 tablets (5-10 mg total) by mouth 3 (three) times daily as needed for muscle spasms.    Dispense:  30 tablet    Refill:  1  . naproxen (NAPROSYN) 500 MG tablet    Sig: Take 1 tablet (500 mg total) by mouth 2 (two) times daily with a meal. For 2-4 weeks then as needed    Dispense:  60 tablet    Refill:  2      Follow up plan: Return in about 6 weeks (around 11/25/2016) for BP check, R Hamstring.  Saralyn Pilar, DO [facility] 10/14/2016, 9:03 AM

## 2016-10-14 NOTE — Assessment & Plan Note (Signed)
Consistent with new acute R facial cheek abscess with firm induration with some surrounding erythema but remains localized without clear cellulitis or any systemic symptoms. Concern with some extension of pain over jawline. - No history of recurrent abcesses in this or other areas, not consistent with chronic suppurativa hidradenitis. No recent antibiotics.  Plan: 1. Given location and lack of fluctuance - decision to defer I&D today 2. Start double antibiotic coverage with Augmentin and Doxcycyline BID for 10 days - potential side effects, precautions given 3. Warm compresses regularly 4. Naproxen for leg can help as NSAID, Tylenol PRN 5. Strict return precautions given if not improving, see AVS - [facility] 6. Follow-up sooner 1-2 weeks as needed

## 2016-10-14 NOTE — Assessment & Plan Note (Addendum)
Suspected partial hamstring tear, acute R posterior thigh hamstring pain with localized bruising posterior R knee, inciting injury 3 days ago with softball injury while running and twisting (turning corner) non traumatic injury, without audible or sensation of pop, remained ambulatory - Gradually improving, still painful, muscle spasm. Without knee or hip symptoms. - Able to bear weight without significant problem, no knee instability - No prior history of Leg injury - Not responding to conservative therapy with NSAID only  Plan: 1. Start anti-inflammatory trial with rx Naprosyn 500mg  BID wc x 2-4 weeks, then PRN 2. Start Tylenol 500-1000mg  per dose TID PRN breakthrough - Start muscle relaxant with Flexeril 10mg  tabs - take 5-10mg  up to TID PRN, titrate up as tolerated 3. RICE therapy (rest, ice, compression, elevation) for swelling, activity modification 4. Hold imaging today - most benefit likely from [facility] ultrasound in future to evaluate partial muscle tear 5. Gradual increase activity as tolerated, let pain be guide 6. Follow-up 6 weeks as planned - may need guided PT regimen

## 2016-10-14 NOTE — Patient Instructions (Signed)
Thank you for coming in to clinic today.  1. Facial Abscess - Start both antibiotics today, take each one twice a day with food for 10 days - Use hot compress on face few times a day help drain any potential pus - Keep close watch on facial redness is spreading, pain worsening, swelling worsening, fevers, nausea, vomiting can't take medicines > then immediately go to Hospital ED for further treatment, may need IV antibiotics or sometimes Head CT imaging to check deeper abscess  2. For Hamstring, likely partial tear -  Recommend trial of Anti-inflammatory with Naproxen (Naprosyn) 500mg  tabs - take one with food and plenty of water TWICE daily every day (breakfast and dinner), for next 2 to 4 weeks, then you may take only as needed - DO NOT TAKE any ibuprofen, aleve, motrin while you are taking this medicine - It is safe to take Tylenol Ext Str 500mg  tabs - take 1 to 2 (max dose 1000mg ) every 6 hours as needed for breakthrough pain, max 24 hour daily dose is 6 to 8 tablets or 4000mg   Start Cyclobenzapine (Flexeril) 10mg  tablets (muscle relaxant) - start with half (cut) to one whole pill at night for muscle relaxant - may make you sedated or sleepy (be careful driving or working on this) if tolerated you can take half to whole tab 2 to 3 times daily or every 8 hours as needed  Use ice packs and elevation for swelling and bruising  Keep stretching, stay active but do not over do it. Let pain be your guide  3. For elevated BP - Check regularly about once daily different times, when comfortable seated 5 min, at home, write down readings - No diagnosis yet of HTN - Likely with infection and pain, among other causes - Try to limit mountain dew caffeine  Please schedule a follow-up appointment with Dr. Althea CharonKaramalegos in 6 weeks for BP check, R Hamstring  If you have any other questions or concerns, please feel free to call the clinic or send a message through MyChart. You may also schedule an earlier  appointment if necessary.  Saralyn PilarAlexander Kemauri Musa, DO [facility]

## 2016-10-14 NOTE — Assessment & Plan Note (Signed)
Mild elevated BP initially today, repeat manual improved SBP but still elevated DBP >90. No prior dx HTN or Pre-HTN, but at [employer] physical yesterday had persistent elevated BP >140/100 - No fam history of HTN. No prior meds  Plan: 1. Reassurance today, suspect other factors raising BP, with acute R hamstring leg injury, facial abscess infection - treat these underlying problems first 2. Hold new dx HTN and new start anti-HTN med at this time 3. Check home BP regularly, given handout BP log - check daily 4. Follow-up 6 weeks for re-check BP, if elevated still at that time and home readings elevated, will consider new dx and new start thiazide, ccb, or ACEi

## 2016-10-17 ENCOUNTER — Encounter: Payer: Self-pay | Admitting: Emergency Medicine

## 2016-10-17 ENCOUNTER — Emergency Department
Admission: RE | Admit: 2016-10-17 | Discharge: 2016-10-17 | Disposition: A | Discharge status: Home / Self Care | Payer: BLUE CROSS/BLUE SHIELD | Attending: Emergency Medicine | Admitting: Emergency Medicine

## 2016-10-17 DIAGNOSIS — L0201 Cutaneous abscess of face: Secondary | ICD-10-CM | POA: Diagnosis present

## 2016-10-17 DIAGNOSIS — L03211 Cellulitis of face: Secondary | ICD-10-CM | POA: Diagnosis not present

## 2016-10-17 MED ORDER — LIDOCAINE HCL (PF) 1 % IJ SOLN
5.0000 mL | Freq: Once | INTRAMUSCULAR | Status: DC
  Filled 2016-10-17: qty 5

## 2016-10-17 NOTE — Discharge Instructions (Signed)

## 2016-10-17 NOTE — ED Provider Notes (Signed)
[facility] Emergency Department Provider Note  ____________________________________________  Time seen: Approximately 9:03 AM  I have reviewed the triage vital signs and the nursing notes.   HISTORY  Chief Complaint Abscess   HPI Howard Smith is a 51 y.o. male who presents for evaluation of a facial abscess. Patient was seen by PCP 3 days ago for similar complaint and was started onAugmentin and doxycycline which he has been taking. The redness has resolved, and the swelling has improved according to patient. patient reports that he has been using warm compresses with a lot of pus was expressed yesterday. He reports the abscess looks much better however he is concerned that there is still fluctuance and wanted it to be rechecked. No fever or chills, no nausea or vomiting.  Past Medical History:  Diagnosis Date  . Heartburn     Patient Active Problem List   Diagnosis Date Noted  . Facial abscess 10/14/2016  . Elevated BP without diagnosis of hypertension 10/14/2016  . Partial hamstring tear 10/14/2016  . Vertigo, intermittent 06/26/2016    Past Surgical History:  Procedure Laterality Date  . none      Prior to Admission medications   Medication Sig Start Date End Date Taking? Authorizing Provider  amoxicillin-clavulanate (AUGMENTIN) 875-125 MG tablet Take 1 tablet by mouth 2 (two) times daily. For 10 days 10/14/16   Smitty Cords, DO  cyclobenzaprine (FLEXERIL) 10 MG tablet Take 0.5-1 tablets (5-10 mg total) by mouth 3 (three) times daily as needed for muscle spasms. 10/14/16   Smitty Cords, DO  doxycycline (VIBRAMYCIN) 100 MG capsule Take 1 capsule (100 mg total) by mouth 2 (two) times daily. For 10 days. Take with full glass water and stay upright for 30 min after 10/14/16   Smitty Cords, DO  naproxen (NAPROSYN) 500 MG tablet Take 1 tablet (500 mg total) by mouth 2 (two) times daily with a meal. For 2-4 weeks then as  needed 10/14/16   Smitty Cords, DO  tamsulosin (FLOMAX) 0.4 MG CAPS capsule Take 1 capsule (0.4 mg total) by mouth daily. Patient not taking: Reported on 10/14/2016 06/26/16   Smitty Cords, DO    Allergies Patient has no known allergies.  Family History  Problem Relation Age of Onset  . Heart disease Father   . Cancer - Lung Father   . Bipolar disorder Mother     Social History Social History  Substance Use Topics  . Smoking status: Never Smoker  . Smokeless tobacco: Never Used  . Alcohol use No    Review of Systems  Constitutional: Negative for fever. Eyes: Negative for visual changes. ENT: Negative for sore throat. Neck: No neck pain  Cardiovascular: Negative for chest pain. Respiratory: Negative for shortness of breath. Gastrointestinal: Negative for abdominal pain, vomiting or diarrhea. Genitourinary: Negative for dysuria. Musculoskeletal: Negative for back pain. Skin: Negative for rash. + facial abscess Neurological: Negative for headaches, weakness or numbness. Psych: No SI or HI  ____________________________________________   PHYSICAL EXAM:  VITAL SIGNS: ED Triage Vitals  Enc Vitals Group     BP 10/17/16 0850 (!) 141/92     Pulse Rate 10/17/16 0850 79     Resp 10/17/16 0850 16     Temp 10/17/16 0850 97.6 F (36.4 C)     Temp Source 10/17/16 0850 Oral     SpO2 10/17/16 0850 97 %     Weight 10/17/16 0841 234 lb (106.1 kg)  Height 10/17/16 0841 6\' 1"  (1.854 m)     Head Circumference --      Peak Flow --      Pain Score --      Pain Loc --      Pain Edu? --      Excl. in GC? --     Constitutional: Alert and oriented. Well appearing and in no apparent distress. HEENT:      Head: Normocephalic and atraumatic.         Eyes: Conjunctivae are normal. Sclera is non-icteric. EOMI. PERRL      Mouth/Throat: Mucous membranes are moist.       Neck: Supple with no signs of meningismus. Cardiovascular: Regular rate and rhythm. No  murmurs, gallops, or rubs. 2+ symmetrical distal pulses are present in all extremities. No JVD. Respiratory: Normal respiratory effort. Lungs are clear to auscultation bilaterally. No wheezes, crackles, or rhonchi.  Musculoskeletal: Nontender with normal range of motion in all extremities. No edema, cyanosis, or erythema of extremities. Neurologic: Normal speech and language. Face is symmetric. Moving all extremities. No gross focal neurologic deficits are appreciated. Skin: There is a scab with no overlying erythema and mild swelling, no tenderness, with 1inch area of induration and no fluctuance Psychiatric: Mood and affect are normal. Speech and behavior are normal.  ____________________________________________   LABS (all labs ordered are listed, but only abnormal results are displayed)  Labs Reviewed - No data to display ____________________________________________  EKG  none  ____________________________________________  RADIOLOGY  none  ____________________________________________   PROCEDURES  Procedure(s) performed: None Procedures Critical Care performed:  None ____________________________________________   INITIAL IMPRESSION / ASSESSMENT AND PLAN / ED COURSE  51 y.o. male who presents for evaluation of a facial abscess. Patient was started on antibiotics 3 days ago with significant improvement and drainage of pus. Looking at the PCPs note there is a picture of for the infection looked initially and the swelling has gotten markedly down. The erythema has fully resolved. Patient has mostly a scab with no overlying erythema, no tenderness, and a small amount of induration. I believe at this time that antibiotics are working. I discussed risks and benefits of I&D with patient especially the risks of scarring in the setting of significant improvement in 3 days of antibiotics and no systemic symptoms of worsening infection. Patient agrees that he does not want it drained at  this time and will continue the antibiotics and warm compresses. I discussed return precautions of worsening infection and recommended that he comes to the emergency department if these develop. Also recommend close follow-up with PCP on Monday for recheck of his wound. Patient will continue prescribed antibiotics.    Pertinent labs & imaging results that were available during my care of the patient were reviewed by me and considered in my medical decision making (see chart for details).    ____________________________________________   FINAL CLINICAL IMPRESSION(S) / ED DIAGNOSES  Final diagnoses:  Cellulitis of face      NEW MEDICATIONS STARTED DURING THIS VISIT:  New Prescriptions   No medications on file     Note:  This document was prepared using Dragon voice recognition software and may include unintentional dictation errors.    Nita Sicklearolina Harlo Fabela, MD 10/17/16 [phone]

## 2016-10-17 NOTE — ED Notes (Signed)
MD showed pt picture of original picture of face. Redness and swelling gone down significantly. Pt instructed to keep taking antibiotics and continue with warm compresses. Pt verbalized understanding.

## 2016-10-17 NOTE — ED Triage Notes (Addendum)
Patient to ER for c/o abscess to right face. Patient has been seen by PCP and has been on oral antibiotics. States abscess has not improved, was instructed to come to ER for possible drainage of abscess and IV antibiotics.  Abscess started on Tuesday, has been on antibiotics since Wednesday afternoon.

## 2016-10-20 ENCOUNTER — Ambulatory Visit (INDEPENDENT_AMBULATORY_CARE_PROVIDER_SITE_OTHER): Payer: BLUE CROSS/BLUE SHIELD | Admitting: Family Medicine

## 2016-10-20 ENCOUNTER — Encounter: Payer: Self-pay | Admitting: Family Medicine

## 2016-10-20 VITALS — BP 136/94 | HR 74 | Temp 98.1°F | Resp 16 | Ht 73.0 in | Wt 237.0 lb

## 2016-10-20 DIAGNOSIS — E7800 Pure hypercholesterolemia, unspecified: Secondary | ICD-10-CM

## 2016-10-20 DIAGNOSIS — L0201 Cutaneous abscess of face: Secondary | ICD-10-CM

## 2016-10-20 DIAGNOSIS — I10 Essential (primary) hypertension: Secondary | ICD-10-CM | POA: Diagnosis not present

## 2016-10-20 DIAGNOSIS — R7309 Other abnormal glucose: Secondary | ICD-10-CM

## 2016-10-20 MED ORDER — AMLODIPINE BESYLATE 10 MG PO TABS: 5.0000 mg | ORAL_TABLET | Freq: Every day | ORAL | 5 refills | Status: DC

## 2016-10-20 NOTE — Assessment & Plan Note (Addendum)
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 No prior meds - Still suspect some aspect of recent elevated BP due to facial abscess/infection - Likely etiology for description of neck pulsation symptoms, no bruits or complication identified on exam  Plan: 1. Discussion on new dx HTN - prognosis, management, complications 2. Start Amlodipine 5mg  daily (half of 10mg  tab) if improved but still not at goal < 140/90 then increase to whole tab 10mg  - counseling on potential side effects, reviewed other anti-HTN meds as well 3. Continue check home BP regularly - check daily 4. Check labs with CMET, Lipids, TSH, A1c 5. 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

## 2016-10-20 NOTE — Progress Notes (Signed)
Subjective:    Patient ID: Howard Smith, male    DOB: 11-Feb-1966, 51 y.o.   MRN: [id number]  Howard Smith is a 51 y.o. male presenting on 10/20/2016 for Hypertension (B/P for DOT physical was 145/101 and today 137/101)   HPI   HTN, new diagnosis: - Last visit 10/14/16 discussion on concern for new diagnosis HTN, considered only elevated BP given acute cellulitis/abscess and only recent outside data with elevated BP, advised to check BP at home, and follow-up before repeat DOT physical in 90 days - Today he returns with repeated outside BP readings persistently elevated >140/90, mostly concern about DBP >90-100, he is concerned about dx HTN and interested in medication. Additionally admits sensation of feeling "pulsing and fullness in head/neck bilaterally", otherwise no symptoms with HTN - He can return to DOT physical within 90 days once BP is controlled - No known family history of HTN. Has Father with Heart Disease MI >60+ Current Meds - none, never on anti-HTN   Associated Factors: - No acute stressors. Does have recent illness with R facial cellulitis/abscess and R hamstring strain - Previously drank diet mountain dew, 3-4 of 20 oz daily - now improving and limited mtn dew intake - Regular exercise Denies CP, dyspnea, HA, edema, dizziness / lightheadedness  FOLLOW-UP Facial Abscess, Right - Last seen by me on 3/21 for initial evaluation with acute R facial abscess with early cellulitis, he was started on dual antibiotics with Augmentin and Doxycycline, also warm compresses - Interval he presented to ED on 10/17/16 for re-evaluation, and ultimately due to improvement on antibiotics, he opted to not due I&D and follow-up - Today reports still improving, he did have drainage earlier in course and now resolving - Denies fevers/chills, sweats, spreading redness, swelling, nausea, vomiting   Social History  Substance Use Topics  . Smoking status: Never Smoker  . Smokeless tobacco: Never  Used  . Alcohol use No    Review of Systems Per HPI unless specifically indicated above     Objective:    BP (!) 136/94 (BP Location: Left Arm, Cuff Size: Normal)   Pulse 74   Temp 98.1 F (36.7 C) (Oral)   Resp 16   Ht 6\' 1"  (1.854 m)   Wt 237 lb (107.5 kg)   BMI 31.27 kg/m   Wt Readings from Last 3 Encounters:  10/20/16 237 lb (107.5 kg)  10/17/16 234 lb (106.1 kg)  10/14/16 234 lb (106.1 kg)    Physical Exam  Constitutional: He is oriented to person, place, and time. He appears well-developed and well-nourished. No distress.  Well-appearing, comfortable, cooperative  HENT:  Head: Normocephalic and atraumatic.  Mouth/Throat: Oropharynx is clear and moist.  Eyes: Conjunctivae are normal.  Neck: Normal range of motion. Neck supple.  No carotid bruits  Cardiovascular: Normal rate, regular rhythm, normal heart sounds and intact distal pulses.   No murmur heard. Pulmonary/Chest: Effort normal and breath sounds normal. No respiratory distress. He has no wheezes. He has no rales.  Neurological: He is alert and oriented to person, place, and time.  Skin: Skin is warm and dry. No rash noted. He is not diaphoretic. No erythema (Resolved R face).  Right facial cheek - Now resolved erythema and edema, with larger healing scab and localized firm induration is improved, no fluctuance or drainage. Mild tender  Psychiatric: His behavior is normal.  Nursing note and vitals reviewed.      Assessment & Plan:   Problem List Items Addressed  This Visit    RESOLVED: Facial abscess    Significantly improved over past 6 days now on dual antibiotics. No further drainage, cellulitis is resolving without any extension. No systemic symptoms. - ED visit 3/24 as well without further intervention, not indicated for I&D - Complete current antibiotic course Augmentin, Doxycycline for 10 days, last dose 3/30      Relevant Orders   CBC with Differential/Platelet   Essential hypertension -  Primary    New diagnosis HTN, after recent persistently elevated BP >140/90, with frequent DBP >100, multiple readings outside BP and office electronic/manual readings, DOT - No prior dx HTN - No fam history of HTN. No prior meds - Still suspect some aspect of recent elevated BP due to facial abscess/infection - Likely etiology for description of neck pulsation symptoms, no bruits or complication identified on exam  Plan: 1. Discussion on new dx HTN - prognosis, management, complications 2. Start Amlodipine 5mg  daily (half of 10mg  tab) if improved but still not at goal < 140/90 then increase to whole tab 10mg  - counseling on potential side effects, reviewed other anti-HTN meds as well 3. Continue check home BP regularly - check daily 4. Check labs with CMET, Lipids, TSH, A1c 5. 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 amLODipine (NORVASC) 10 MG tablet    Sig: Take 0.5-1 tablets (5-10 mg total) by mouth daily.    Dispense:  30 tablet    Refill:  5      Follow up plan: Return in about 6 weeks (around 12/01/2016) for HTN follow-up.  Saralyn PilarAlexander Djibril Glogowski, DO [facility] 10/20/2016, 11:24 PM

## 2016-10-20 NOTE — Patient Instructions (Addendum)
Thank you for coming in to clinic today.  New diagnosis Hypertension (HTN) - Start new medicine Amlodipine 10mg  tablets - take once daily in morning - you can start with half tablet cut in half, if BP is not well controlled >138-140/90 consistently then go ahead and increase up to WHOLE tablet daily  - Check regularly about once daily different times, when comfortable seated 5 min, at home, write down readings  You will be due for FASTING BLOOD WORK (no food or drink after midnight before, only water or coffee without cream/sugar on the morning of)  - Please go ahead and schedule a "Lab Only" visit in the morning at the clinic for lab draw [facility] - Make sure Lab Only appointment is at least 1-2 weeks before your next appointment, so that results will be available  For Lab Results, once available within 2-3 days of blood draw, you can can log in to MyChart online to view your results and a brief explanation. Also, we can discuss results at next follow-up visit.  Please schedule a follow-up appointment with Dr. Althea CharonKaramalegos in 6 weeks for HTN follow-up  If you have any other questions or concerns, please feel free to call the clinic or send a message through MyChart. You may also schedule an earlier appointment if necessary.  Saralyn PilarAlexander Karamalegos, DO [facility]

## 2016-10-20 NOTE — Assessment & Plan Note (Signed)
Significantly improved over past 6 days now on dual antibiotics. No further drainage, cellulitis is resolving without any extension. No systemic symptoms. - ED visit 3/24 as well without further intervention, not indicated for I&D - Complete current antibiotic course Augmentin, Doxycycline for 10 days, last dose 3/30

## 2016-10-20 NOTE — Assessment & Plan Note (Signed)
Last lipid 2016 with elevated LDL, never on statin or cholesterol therapy Due for next fasting lipid, concern now with new dx HTN Check fasting labs within 1 week, follow-up

## 2016-10-22 LAB — COMPREHENSIVE METABOLIC PANEL
ALK PHOS: 98 IU/L (ref 39–117)
ALT: 49 IU/L — ABNORMAL HIGH (ref 0–44)
AST: 37 IU/L (ref 0–40)
Albumin/Globulin Ratio: 1.6 (ref 1.2–2.2)
Albumin: 4.4 g/dL (ref 3.5–5.5)
BUN/Creatinine Ratio: 21 — ABNORMAL HIGH (ref 9–20)
BUN: 19 mg/dL (ref 6–24)
Bilirubin Total: 0.3 mg/dL (ref 0.0–1.2)
CALCIUM: 9.5 mg/dL (ref 8.7–10.2)
CHLORIDE: 103 mmol/L (ref 96–106)
CO2: 22 mmol/L (ref 18–29)
CREATININE: 0.89 mg/dL (ref 0.76–1.27)
GFR calc Af Amer: 116 mL/min/{1.73_m2} (ref >59)
GFR calc non Af Amer: 100 mL/min/{1.73_m2} (ref >59)
GLOBULIN, TOTAL: 2.7 g/dL (ref 1.5–4.5)
Glucose: 87 mg/dL (ref 65–99)
Potassium: 5.1 mmol/L (ref 3.5–5.2)
Sodium: 140 mmol/L (ref 134–144)
Total Protein: 7.1 g/dL (ref 6.0–8.5)

## 2016-10-22 LAB — CBC WITH DIFFERENTIAL/PLATELET
Basophils Absolute: 0 10*3/uL (ref 0.0–0.2)
Basos: 0 %
EOS (ABSOLUTE): 0.1 10*3/uL (ref 0.0–0.4)
EOS: 2 %
HEMATOCRIT: 46.3 % (ref 37.5–51.0)
HEMOGLOBIN: 15.8 g/dL (ref 13.0–17.7)
IMMATURE GRANS (ABS): 0 10*3/uL (ref 0.0–0.1)
Immature Granulocytes: 0 %
LYMPHS ABS: 2.1 10*3/uL (ref 0.7–3.1)
LYMPHS: 39 %
MCH: 29.2 pg (ref 26.6–33.0)
MCHC: 34.1 g/dL (ref 31.5–35.7)
MCV: 85 fL (ref 79–97)
Monocytes Absolute: 0.3 10*3/uL (ref 0.1–0.9)
Monocytes: 5 %
NEUTROS ABS: 2.8 10*3/uL (ref 1.4–7.0)
Neutrophils: 54 %
Platelets: 226 10*3/uL (ref 150–379)
RBC: 5.42 x10E6/uL (ref 4.14–5.80)
RDW: 13.8 % (ref 12.3–15.4)
WBC: 5.3 10*3/uL (ref 3.4–10.8)

## 2016-10-22 LAB — LIPID PANEL
CHOLESTEROL TOTAL: 155 mg/dL (ref 100–199)
Chol/HDL Ratio: 4.1 ratio units (ref 0.0–5.0)
HDL: 38 mg/dL — ABNORMAL LOW (ref >39)
LDL CALC: 95 mg/dL (ref 0–99)
TRIGLYCERIDES: 111 mg/dL (ref 0–149)
VLDL CHOLESTEROL CAL: 22 mg/dL (ref 5–40)

## 2016-10-22 LAB — HEMOGLOBIN A1C
ESTIMATED AVERAGE GLUCOSE: 108 mg/dL
HEMOGLOBIN A1C: 5.4 % (ref 4.8–5.6)

## 2016-10-22 LAB — TSH: TSH: 1.29 u[IU]/mL (ref 0.450–4.500)

## 2018-02-11 DIAGNOSIS — M542 Cervicalgia: Secondary | ICD-10-CM | POA: Diagnosis not present

## 2018-02-11 DIAGNOSIS — M7542 Impingement syndrome of left shoulder: Secondary | ICD-10-CM | POA: Diagnosis not present

## 2018-03-21 DIAGNOSIS — H8110 Benign paroxysmal vertigo, unspecified ear: Secondary | ICD-10-CM | POA: Diagnosis not present

## 2018-08-30 DIAGNOSIS — J218 Acute bronchiolitis due to other specified organisms: Secondary | ICD-10-CM | POA: Diagnosis not present

## 2018-09-24 DIAGNOSIS — R509 Fever, unspecified: Secondary | ICD-10-CM | POA: Diagnosis not present

## 2018-09-24 DIAGNOSIS — J111 Influenza due to unidentified influenza virus with other respiratory manifestations: Secondary | ICD-10-CM | POA: Diagnosis not present

## 2018-09-24 DIAGNOSIS — J029 Acute pharyngitis, unspecified: Secondary | ICD-10-CM | POA: Diagnosis not present

## 2018-10-19 DIAGNOSIS — J101 Influenza due to other identified influenza virus with other respiratory manifestations: Secondary | ICD-10-CM | POA: Diagnosis not present

## 2018-10-19 DIAGNOSIS — J209 Acute bronchitis, unspecified: Secondary | ICD-10-CM | POA: Diagnosis not present

## 2018-10-21 ENCOUNTER — Ambulatory Visit
Admission: RE | Admit: 2018-10-21 | Discharge: 2018-10-21 | Disposition: A | Discharge status: Home / Self Care | Payer: Self-pay | Source: Ambulatory Visit | Attending: Internal Medicine | Admitting: Internal Medicine

## 2018-10-21 ENCOUNTER — Other Ambulatory Visit: Payer: Self-pay | Admitting: Internal Medicine

## 2018-10-21 DIAGNOSIS — R918 Other nonspecific abnormal finding of lung field: Secondary | ICD-10-CM

## 2018-10-27 DIAGNOSIS — I10 Essential (primary) hypertension: Secondary | ICD-10-CM | POA: Diagnosis not present

## 2018-10-27 DIAGNOSIS — R002 Palpitations: Secondary | ICD-10-CM | POA: Diagnosis not present

## 2018-10-27 DIAGNOSIS — E7800 Pure hypercholesterolemia, unspecified: Secondary | ICD-10-CM | POA: Diagnosis not present

## 2018-11-02 ENCOUNTER — Other Ambulatory Visit: Payer: Self-pay | Admitting: Internal Medicine

## 2018-11-02 DIAGNOSIS — Z0001 Encounter for general adult medical examination with abnormal findings: Secondary | ICD-10-CM | POA: Diagnosis not present

## 2018-11-02 DIAGNOSIS — R911 Solitary pulmonary nodule: Secondary | ICD-10-CM

## 2018-11-02 DIAGNOSIS — R05 Cough: Secondary | ICD-10-CM | POA: Diagnosis not present

## 2018-11-02 DIAGNOSIS — R079 Chest pain, unspecified: Secondary | ICD-10-CM | POA: Diagnosis not present

## 2018-11-03 DIAGNOSIS — Z125 Encounter for screening for malignant neoplasm of prostate: Secondary | ICD-10-CM | POA: Diagnosis not present

## 2018-11-03 DIAGNOSIS — R079 Chest pain, unspecified: Secondary | ICD-10-CM | POA: Diagnosis not present

## 2018-11-03 DIAGNOSIS — Z0001 Encounter for general adult medical examination with abnormal findings: Secondary | ICD-10-CM | POA: Diagnosis not present

## 2018-11-03 DIAGNOSIS — I10 Essential (primary) hypertension: Secondary | ICD-10-CM | POA: Diagnosis not present

## 2018-11-03 DIAGNOSIS — R05 Cough: Secondary | ICD-10-CM | POA: Diagnosis not present

## 2018-11-18 ENCOUNTER — Ambulatory Visit: Admission: RE | Admit: 2018-11-18 | Payer: Commercial Managed Care - PPO | Source: Ambulatory Visit

## 2018-12-26 ENCOUNTER — Ambulatory Visit
Admission: RE | Admit: 2018-12-26 | Discharge: 2018-12-26 | Disposition: A | Discharge status: Home / Self Care | Payer: Commercial Managed Care - PPO | Source: Ambulatory Visit | Attending: Internal Medicine | Admitting: Internal Medicine

## 2018-12-26 ENCOUNTER — Other Ambulatory Visit: Payer: Self-pay

## 2018-12-26 DIAGNOSIS — R911 Solitary pulmonary nodule: Secondary | ICD-10-CM | POA: Insufficient documentation

## 2018-12-26 LAB — POCT I-STAT CREATININE: Creatinine, Ser: 1.2 mg/dL (ref 0.61–1.24)

## 2018-12-26 MED ORDER — IOHEXOL 300 MG/ML  SOLN
75.0000 mL | Freq: Once | INTRAMUSCULAR | Status: AC | PRN
  Administered 2018-12-26: 75 mL via INTRAVENOUS

## 2019-01-04 ENCOUNTER — Ambulatory Visit: Payer: Self-pay | Admitting: Family Medicine

## 2019-01-04 ENCOUNTER — Other Ambulatory Visit: Payer: Self-pay

## 2019-01-04 ENCOUNTER — Ambulatory Visit (INDEPENDENT_AMBULATORY_CARE_PROVIDER_SITE_OTHER): Payer: Commercial Managed Care - PPO | Admitting: Family Medicine

## 2019-01-04 ENCOUNTER — Encounter: Payer: Self-pay | Admitting: Family Medicine

## 2019-01-04 VITALS — BP 131/94 | HR 89 | Temp 98.7°F | Ht 72.0 in | Wt 229.0 lb

## 2019-01-04 DIAGNOSIS — I10 Essential (primary) hypertension: Secondary | ICD-10-CM

## 2019-01-04 DIAGNOSIS — G4700 Insomnia, unspecified: Secondary | ICD-10-CM | POA: Diagnosis not present

## 2019-01-04 DIAGNOSIS — Z7689 Persons encountering health services in other specified circumstances: Secondary | ICD-10-CM | POA: Diagnosis not present

## 2019-01-04 DIAGNOSIS — R000 Tachycardia, unspecified: Secondary | ICD-10-CM

## 2019-01-04 NOTE — Patient Instructions (Signed)
Unisom - 1/2 to full tab nightly as needed. Can combine with melatonin as needed.   Look online for sleep therapy programs.

## 2019-01-04 NOTE — Progress Notes (Signed)
BP (!) 131/94   Pulse 89   Temp 98.7 F (37.1 C) (Oral)   Ht 6' (1.829 m)   Wt 229 lb (103.9 kg)   SpO2 96%   BMI 31.06 kg/m    Subjective:    Patient ID: Howard Smith, male    DOB: 01-Jan-1966, 53 y.o.   MRN: [id number]  HPI: Howard SabinsJohn H Heikkila is a 53 y.o. male  Chief Complaint  Patient presents with  . Establish Care    pt would like to discuss about his BP and heart rate   Here today to establish care.   BP and HR was high several months ago when he was at the doctor for the flu. HR is staying around 115, so on metoprolol per Dr. Cassie FreerParachos his Cardiologist. Since starting the medication notes HRs have been stable between 70-90. No palpitations, CP, SOB, HAs, dizziness. Etiology of his tachycardia unknown at this time which is bothering patient. He states he does not want to be on the medicine long term if possible. Also taking losartan for HTN with fairly good BP control. Denies side effects.   Long history of sleep issues, 3-3.5 hours of sleep. Falls asleep easily but wakes multiple times and can't return to sleep. Tried melatonin with no relief. Denies snoring, apnea episodes, hx of anxiety. Has never been on medication for this in the past.   Recently, had abnormal CXR showing a lung nodule so had CT chest done 6/1 which was reassuring without any nodules or other abnormal findings. Asymptomatic.   No other chronic medical conditions or concerns noted today.   [facility] screen [facility] 01/04/2019 05/01/2015  Decreased Interest 0 0  Down, Depressed, Hopeless 0 0  PHQ - 2 Score 0 0  Altered sleeping 3 -  Tired, decreased energy 3 -  Change in appetite 0 -  Feeling bad or failure about yourself  0 -  Trouble concentrating 0 -  Moving slowly or fidgety/restless 0 -  Suicidal thoughts 0 -  PHQ-9 Score 6 -   GAD 7 : Generalized Anxiety Score 01/04/2019  Nervous, Anxious, on Edge 0  Control/stop worrying 0  Worry too much - different things 0  Trouble relaxing 0  Restless 1   Easily annoyed or irritable 2  Afraid - awful might happen 0  Total GAD 7 Score 3   Relevant past medical, surgical, family and social history reviewed and updated as indicated. Interim medical history since our last visit reviewed. Allergies and medications reviewed and updated.  Review of Systems  Per HPI unless specifically indicated above     Objective:    BP (!) 131/94   Pulse 89   Temp 98.7 F (37.1 C) (Oral)   Ht 6' (1.829 m)   Wt 229 lb (103.9 kg)   SpO2 96%   BMI 31.06 kg/m   Wt Readings from Last 3 Encounters:  01/04/19 229 lb (103.9 kg)  10/20/16 237 lb (107.5 kg)  10/17/16 234 lb (106.1 kg)    Physical Exam Vitals signs and nursing note reviewed.  Constitutional:      Appearance: Normal appearance.  HENT:     Head: Atraumatic.  Eyes:     Extraocular Movements: Extraocular movements intact.     Conjunctiva/sclera: Conjunctivae normal.  Neck:     Musculoskeletal: Normal range of motion and neck supple.  Cardiovascular:     Rate and Rhythm: Normal rate and regular rhythm.  Pulmonary:     Effort: Pulmonary effort  is normal.     Breath sounds: Normal breath sounds.  Musculoskeletal: Normal range of motion.  Skin:    General: Skin is warm and dry.  Neurological:     General: No focal deficit present.     Mental Status: He is oriented to person, place, and time.  Psychiatric:        Mood and Affect: Mood normal.        Thought Content: Thought content normal.        Judgment: Judgment normal.     Results for orders placed or performed during the hospital encounter of 12/26/18  I-STAT creatinine  Result Value Ref Range   Creatinine, Ser 1.20 0.61 - 1.24 mg/dL      Assessment & Plan:   Problem List Items Addressed This Visit      Cardiovascular and Mediastinum   Essential hypertension   Relevant Medications   hydrochlorothiazide (HYDRODIURIL) 25 MG tablet   metoprolol succinate (TOPROL-XL) 25 MG 24 hr tablet     Other   Insomnia    Long  discussion about sleep hygiene, medication options. Patient wanting to stay away from medications if possible. Will work on sleep hygiene, counseling recommended, melatonin and or unisom recommended. Low suspicion at this time for sleep apnea but will perform sleep study if sxs persist and are refractory to multiple tx's.        Other Visit Diagnoses    Encounter to establish care    -  Primary   Tachycardia       On metoprolol, followed by Cardiology. Discussed following up with them for further management       Follow up plan: Return for CPE.

## 2019-01-09 DIAGNOSIS — G4700 Insomnia, unspecified: Secondary | ICD-10-CM | POA: Insufficient documentation

## 2019-01-09 NOTE — Assessment & Plan Note (Signed)
Long discussion about sleep hygiene, medication options. Patient wanting to stay away from medications if possible. Will work on sleep hygiene, counseling recommended, melatonin and or unisom recommended. Low suspicion at this time for sleep apnea but will perform sleep study if sxs persist and are refractory to multiple tx's.

## 2019-03-08 ENCOUNTER — Encounter: Payer: Commercial Managed Care - PPO | Admitting: Family Medicine

## 2019-08-21 ENCOUNTER — Ambulatory Visit
Admission: EM | Admit: 2019-08-21 | Discharge: 2019-08-21 | Disposition: A | Discharge status: Home / Self Care | Payer: Commercial Managed Care - PPO | Attending: Family Medicine | Admitting: Family Medicine

## 2019-08-21 ENCOUNTER — Encounter: Payer: Self-pay | Admitting: Emergency Medicine

## 2019-08-21 ENCOUNTER — Other Ambulatory Visit: Payer: Self-pay

## 2019-08-21 DIAGNOSIS — R519 Headache, unspecified: Secondary | ICD-10-CM | POA: Diagnosis not present

## 2019-08-21 DIAGNOSIS — R82998 Other abnormal findings in urine: Secondary | ICD-10-CM

## 2019-08-21 DIAGNOSIS — R5383 Other fatigue: Secondary | ICD-10-CM | POA: Diagnosis not present

## 2019-08-21 DIAGNOSIS — R112 Nausea with vomiting, unspecified: Secondary | ICD-10-CM

## 2019-08-21 DIAGNOSIS — B349 Viral infection, unspecified: Secondary | ICD-10-CM

## 2019-08-21 HISTORY — DX: Essential (primary) hypertension: I10

## 2019-08-21 LAB — POCT URINALYSIS DIP (MANUAL ENTRY)
Bilirubin, UA: NEGATIVE
Blood, UA: NEGATIVE
Glucose, UA: NEGATIVE mg/dL
Ketones, POC UA: NEGATIVE mg/dL
Leukocytes, UA: NEGATIVE
Nitrite, UA: NEGATIVE
Protein Ur, POC: NEGATIVE mg/dL
Spec Grav, UA: 1.01 (ref 1.010–1.025)
Urobilinogen, UA: 0.2 E.U./dL
pH, UA: 6 (ref 5.0–8.0)

## 2019-08-21 NOTE — ED Triage Notes (Signed)
Patient in office c/o N/V sluggish and fatigue with HA since Friday also had diarrhea  Denies: fever ,sorethroat   EYC:[id number] AdvilPM

## 2019-08-21 NOTE — ED Provider Notes (Signed)
Renaldo Fiddler    CSN: [id number] Arrival date & time: 08/21/19  1004      History   Chief Complaint Chief Complaint  Patient presents with  . Nausea  . sluggish  . Dizziness    HPI Howard Smith. is a 54 y.o. male.   Reports that he has been having nausea, vomiting, diarrhea, headache, and fatigue since 08/18/19. Reports that he has used immodium and advil with resolution of diarrhea and then headache relief. Denies fever, sore throat, rash, body aches, known COVID exposure. Denies having a COVID test within the last 14 days. Reports that he is still feeling very sluggish, fatigued. Reports that his urine has been darker than normal, denies dysuria, abdominal pain, hematuria. Denies dizziness at this time.   The history is provided by the patient.  Dizziness Associated symptoms: diarrhea, nausea and vomiting   Associated symptoms: no chest pain, no palpitations and no shortness of breath     Past Medical History:  Diagnosis Date  . Heartburn   . Hypertension     Patient Active Problem List   Diagnosis Date Noted  . Insomnia 01/09/2019  . Elevated LDL cholesterol level 10/20/2016  . Essential hypertension 10/14/2016  . Partial hamstring tear 10/14/2016  . Vertigo, intermittent 06/26/2016    Past Surgical History:  Procedure Laterality Date  . none         Home Medications    Prior to Admission medications   Medication Sig Start Date End Date Taking? Authorizing Provider  hydrochlorothiazide (HYDRODIURIL) 25 MG tablet Take by mouth. 10/27/18 10/27/19  [provider]  metoprolol succinate (TOPROL-XL) 25 MG 24 hr tablet Take by mouth. 12/08/18 12/08/19  [provider]    Family History Family History  Problem Relation Age of Onset  . Heart disease Father   . Cancer - Lung Father   . Bipolar disorder Mother   . Breast cancer Maternal Grandmother     Social History Social History   Tobacco Use  . Smoking status: Never  Smoker  . Smokeless tobacco: Never Used  Substance Use Topics  . Alcohol use: No    Alcohol/week: 0.0 standard drinks  . Drug use: No     Allergies   Patient has no known allergies.   Review of Systems Review of Systems  Constitutional: Positive for fatigue. Negative for chills and fever.  HENT: Negative for congestion, ear pain, sinus pressure, sinus pain and sore throat.   Eyes: Negative for pain and visual disturbance.  Respiratory: Negative for cough, shortness of breath and wheezing.   Cardiovascular: Negative for chest pain and palpitations.  Gastrointestinal: Positive for diarrhea, nausea and vomiting. Negative for abdominal pain.  Genitourinary: Negative for difficulty urinating, dysuria, frequency and hematuria.  Musculoskeletal: Negative for arthralgias and back pain.  Skin: Negative for color change and rash.  Neurological: Positive for dizziness. Negative for seizures and syncope.  All other systems reviewed and are negative.    Physical Exam Triage Vital Signs ED Triage Vitals  Enc Vitals Group     BP      Pulse      Resp      Temp      Temp src      SpO2      Weight      Height      Head Circumference      Peak Flow      Pain Score      Pain Loc  Pain Edu?      Excl. in GC?    No data found.  Updated Vital Signs BP (!) 128/91   Pulse 69   Temp 98.1 F (36.7 C) (Oral)   Resp 18   Ht 6\' 2"  (1.88 m)   Wt 230 lb (104.3 kg)   SpO2 95%   BMI 29.53 kg/m    Physical Exam Vitals and nursing note reviewed.  Constitutional:      General: He is not in acute distress.    Appearance: He is well-developed.  HENT:     Head: Normocephalic and atraumatic.     Right Ear: Tympanic membrane normal.     Left Ear: Tympanic membrane normal.     Nose: No rhinorrhea.     Mouth/Throat:     Mouth: Mucous membranes are moist.     Pharynx: No oropharyngeal exudate or posterior oropharyngeal erythema.  Eyes:     Conjunctiva/sclera: Conjunctivae normal.      Pupils: Pupils are equal, round, and reactive to light.  Cardiovascular:     Rate and Rhythm: Normal rate and regular rhythm.     Heart sounds: No murmur.  Pulmonary:     Effort: Pulmonary effort is normal. No respiratory distress.     Breath sounds: Normal breath sounds. No wheezing or rhonchi.  Chest:     Chest wall: No tenderness.  Abdominal:     General: Abdomen is flat. There is no distension.     Palpations: Abdomen is soft.     Tenderness: There is no abdominal tenderness. There is no guarding.  Musculoskeletal:     Cervical back: Normal range of motion and neck supple.  Skin:    General: Skin is warm and dry.     Capillary Refill: Capillary refill takes less than 2 seconds.  Neurological:     General: No focal deficit present.     Mental Status: He is alert and oriented to person, place, and time.  Psychiatric:        Mood and Affect: Mood normal.        Behavior: Behavior normal.      UC Treatments / Results  Labs (all labs ordered are listed, but only abnormal results are displayed) Labs Reviewed  POCT URINALYSIS DIP (MANUAL ENTRY) - Abnormal; Notable for the following components:      Result Value   Color, UA light yellow (*)    All other components within normal limits  NOVEL CORONAVIRUS, NAA    EKG   Radiology No results found.  Procedures Procedures (including critical care time)  Medications Ordered in UC Medications - No data to display  Initial Impression / Assessment and Plan / UC Course  I have reviewed the triage vital signs and the nursing notes.  Pertinent labs & imaging results that were available during my care of the patient were reviewed by me and considered in my medical decision making (see chart for details).     Likely viral illness, will send out COVID swab and inform patient when results are in. Dark urine, UA negative in office today. Follow up with primary care provider if symptoms do no resolve, or sooner if needed.  Instructed to self quarantine until COVID results are in and negative. Instructed on when to report to the emergency department.  Final Clinical Impressions(s) / UC Diagnoses   Final diagnoses:  Nonintractable episodic headache, unspecified headache type  Nausea and vomiting, intractability of vomiting not specified, unspecified vomiting type  Fatigue,  unspecified type  Viral illness  Dark urine     Discharge Instructions     Your COVID test is pending.  You should self quarantine until your test result is back and is negative.    Take Tylenol as needed for fever or discomfort.  Rest and keep yourself hydrated with 8-10 glasses of water each day.    Go to the emergency department if you develop high fever, shortness of breath, severe diarrhea, or other concerning symptoms.       ED Prescriptions    None     PDMP not reviewed this encounter.   Faustino Congress, NP 08/21/19 1044

## 2019-08-21 NOTE — Discharge Instructions (Signed)
Your COVID test is pending.  You should self quarantine until your test result is back and is negative.    Take Tylenol as needed for fever or discomfort.  Rest and keep yourself hydrated with 8-10 glasses of water each day.    Go to the emergency department if you develop high fever, shortness of breath, severe diarrhea, or other concerning symptoms.    

## 2019-08-22 LAB — NOVEL CORONAVIRUS, NAA: SARS-CoV-2, NAA: NOT DETECTED

## 2019-09-04 ENCOUNTER — Ambulatory Visit
Admission: RE | Admit: 2019-09-04 | Discharge: 2019-09-04 | Disposition: A | Discharge status: Home / Self Care | Payer: Commercial Managed Care - PPO | Attending: Chiropractor | Admitting: Chiropractor

## 2019-09-04 ENCOUNTER — Ambulatory Visit
Admission: RE | Admit: 2019-09-04 | Discharge: 2019-09-04 | Disposition: A | Discharge status: Home / Self Care | Payer: Commercial Managed Care - PPO | Source: Ambulatory Visit | Attending: Chiropractor | Admitting: Chiropractor

## 2019-09-04 ENCOUNTER — Other Ambulatory Visit: Payer: Self-pay | Admitting: Chiropractor

## 2019-09-04 DIAGNOSIS — S233XXA Sprain of ligaments of thoracic spine, initial encounter: Secondary | ICD-10-CM | POA: Insufficient documentation

## 2019-09-04 DIAGNOSIS — S134XXA Sprain of ligaments of cervical spine, initial encounter: Secondary | ICD-10-CM

## 2019-09-04 DIAGNOSIS — M25562 Pain in left knee: Secondary | ICD-10-CM | POA: Diagnosis present

## 2019-09-13 ENCOUNTER — Other Ambulatory Visit: Payer: Self-pay

## 2019-09-13 ENCOUNTER — Ambulatory Visit (INDEPENDENT_AMBULATORY_CARE_PROVIDER_SITE_OTHER): Payer: Commercial Managed Care - PPO | Admitting: Internal Medicine

## 2019-09-13 ENCOUNTER — Encounter: Payer: Self-pay | Admitting: Internal Medicine

## 2019-09-13 VITALS — BP 120/90 | HR 73 | Ht 74.0 in | Wt 231.8 lb

## 2019-09-13 DIAGNOSIS — R5382 Chronic fatigue, unspecified: Secondary | ICD-10-CM | POA: Diagnosis not present

## 2019-09-13 DIAGNOSIS — R0602 Shortness of breath: Secondary | ICD-10-CM | POA: Diagnosis not present

## 2019-09-13 DIAGNOSIS — I10 Essential (primary) hypertension: Secondary | ICD-10-CM

## 2019-09-13 DIAGNOSIS — R0789 Other chest pain: Secondary | ICD-10-CM | POA: Diagnosis not present

## 2019-09-13 NOTE — Progress Notes (Signed)
New Outpatient Visit Date: 09/13/2019  Primary CARE provider: Volney Smith, [place],   [id number]  Chief Complaint: Fatigue and chest pain  HPI:  Howard Smith is a 54 y.o. male who is being seen today as a self-referral for the evaluation of elevated blood pressure. He has a history of hypertension.  He was previously followed by Dr. Saralyn Smith, having last been seen in 06/2019.  He also complained of palpitations with unremarkable Holter monitor and exercise tolerance test in the past.  He wishes to have a second opinion.  Today, Howard Smith is most concerned about lack of energy and intermittent chest pain.  He notes that the symptoms have been present for at least a year and are getting worse.  He notes shortness of breath with modest activity and even bending over.  He describes his chest discomfort as a sharp, knifelike pain that can occur in different spots throughout his chest.  It happens randomly, not related to exertion, 2-3 times per week.  Episodes usually last 45 minutes and resolve spontaneously.  Howard Smith denies palpitations but has experienced occasional lightheadedness during which it feels like he has a "strobe light in his head."  He blacked out many years ago but has not passed out recently.  Home blood pressures are usually 130-140/90-95, improved since being on antihypertensive medication.  He does not sleep well and has never been checked for sleep apnea.  He underwent implantation of testosterone pellets last year and had mild transient improvement in his fatigue.  --------------------------------------------------------------------------------------------------  Cardiovascular History & Procedures: Cardiovascular Problems:  Atypical chest pain  Palpitation  Risk Factors:  Hypertension, obesity, and male gender  Cath/PCI:  None  CV Surgery:  None  EP Procedures and Devices:  Holter monitor (01/28/2017, [facility]): Sinus rhythm  with average heart rate of 77 bpm.  No supraventricular or ventricular ectopy.  Non-Invasive Evaluation(s):  Exercise tolerance test (02/03/2017, [facility]): Good exercise capacity without angina or EKG changes consistent with low risk study.  Recent CV Pertinent Labs: Lab Results  Component Value Date   CHOL 155 10/21/2016   HDL 38 (L) 10/21/2016   LDLCALC 95 10/21/2016   TRIG 111 10/21/2016   CHOLHDL 4.1 10/21/2016   K 5.1 10/21/2016   BUN 19 10/21/2016   CREATININE 1.20 12/26/2018    --------------------------------------------------------------------------------------------------  Past Medical History:  Diagnosis Date  . Heartburn   . Hypertension     Past Surgical History:  Procedure Laterality Date  . none      Current Meds  Medication Sig  . hydrochlorothiazide (HYDRODIURIL) 25 MG tablet Take 25 mg by mouth daily.   . metoprolol succinate (TOPROL-XL) 25 MG 24 hr tablet Take 50 mg by mouth daily.   Marland Kitchen omeprazole (PRILOSEC) 20 MG capsule Take 20 mg by mouth daily.    Allergies: Patient has no known allergies.  Social History   Tobacco Use  . Smoking status: Never Smoker  . Smokeless tobacco: Never Used  Substance Use Topics  . Alcohol use: No    Alcohol/week: 0.0 standard drinks  . Drug use: No    Family History  Problem Relation Age of Onset  . Cancer - Lung Father   . Heart failure Father   . Heart attack Father 55  . Bipolar disorder Mother   . Hyperlipidemia Mother   . Hypertension Mother   . Breast cancer Maternal Grandmother     Review of Systems: A 12-system review of systems  was performed and was negative except as noted in the HPI.  --------------------------------------------------------------------------------------------------  Physical Exam: BP 120/90 (BP Location: Right Arm, Patient Position: Sitting, Cuff Size: Normal)   Pulse 73   Ht 6\' 2"  (1.88 m)   Wt 231 lb 12 oz (105.1 kg)   SpO2 98%   BMI 29.75 kg/m   General:  NAD. HEENT: No conjunctival pallor or scleral icterus. Facemask in place. Neck: Supple without lymphadenopathy, thyromegaly, JVD, or HJR. No carotid bruit. Lungs: Normal work of breathing. Clear to auscultation bilaterally without wheezes or crackles. Heart: Regular rate and rhythm without murmurs, rubs, or gallops. Non-displaced PMI. Abd: Bowel sounds present. Soft, NT/ND without hepatosplenomegaly Ext: No lower extremity edema. Radial, PT, and DP pulses are 2+ bilaterally Skin: Warm and dry without rash. Neuro: CNIII-XII intact.  Grossly normal strength and sensation. Psych: Normal mood and affect.  EKG: Normal sinus rhythm without abnormality.  Lab Results  Component Value Date   WBC 5.3 10/21/2016   HGB 15.8 10/21/2016   HCT 46.3 10/21/2016   MCV 85 10/21/2016   PLT 226 10/21/2016    Lab Results  Component Value Date   NA 140 10/21/2016   K 5.1 10/21/2016   CL 103 10/21/2016   CO2 22 10/21/2016   BUN 19 10/21/2016   CREATININE 1.20 12/26/2018   GLUCOSE 87 10/21/2016   ALT 49 (H) 10/21/2016    Lab Results  Component Value Date   CHOL 155 10/21/2016   HDL 38 (L) 10/21/2016   LDLCALC 95 10/21/2016   TRIG 111 10/21/2016   CHOLHDL 4.1 10/21/2016     --------------------------------------------------------------------------------------------------  ASSESSMENT AND PLAN: Fatigue, shortness of breath, and atypical chest pain: Symptoms have been present for at least a year, with prior work-up including exercise tolerance test and Holter monitor in 2018.  No significant abnormality was identified.  I recommended that we obtain a transthoracic echocardiogram to exclude significant structural abnormalities.  Based on results, we will readdress utility of further ischemia testing.  I will also check a CBC, CMP, and TSH to evaluate for other potential causes for his fatigue.  Hypertension: Blood pressure borderline today with diastolic reading of 90 mmHg.  We will continue  metoprolol and HCTZ for now.  I have encouraged Howard Smith to limit his sodium intake.  Follow-up: Return to clinic in 1 month.  2019, MD 09/13/2019 2:27 PM

## 2019-09-13 NOTE — Patient Instructions (Signed)
Medication Instructions:  Your physician recommends that you continue on your current medications as directed. Please refer to the Current Medication list given to you today.   *If you need a refill on your cardiac medications before your next appointment, please call your pharmacy*  Lab Work: Your physician recommends that you return for lab work When you come back for the echocardiogram at the [facility]. - CBC, CMP, TSH. - Please go to the [facility]. You will check in at the front desk to the right as you walk into the atrium. Valet Parking is offered if needed. - No appointment needed. You may go any day between 7 am and 6 pm.  If you have labs (blood work) drawn today and your tests are completely normal, you will receive your results only by: Marland Kitchen MyChart Message (if you have MyChart) OR . A paper copy in the mail If you have any lab test that is abnormal or we need to change your treatment, we will call you to review the results.  Testing/Procedures: Your physician has requested that you have an echocardiogram. Echocardiography is a painless test that uses sound waves to create images of your heart. It provides your doctor with information about the size and shape of your heart and how well your heart's chambers and valves are working. This procedure takes approximately one hour. There are no restrictions for this procedure. You may get an IV, if needed, to receive an ultrasound enhancing agent through to better visualize your heart.   Follow-Up: At [facility], you and your health needs are our priority.  As part of our continuing mission to provide you with exceptional heart care, we have created designated Provider Care Teams.  These Care Teams include your primary Cardiologist (physician) and Advanced Practice Providers (APPs -  Physician Assistants and Nurse Practitioners) who all work together to provide you with the care you need, when you need it.  Your next  appointment:   1 month(s)  The format for your next appointment:   In Person  Provider:    You may see DR Harrell Gave END or one of the following Advanced Practice Providers on your designated Care Team:    Murray Hodgkins, NP  Christell Faith, PA-C  Marrianne Mood, PA-C   Echocardiogram An echocardiogram is a procedure that uses painless sound waves (ultrasound) to produce an image of the heart. Images from an echocardiogram can provide important information about:  Signs of coronary artery disease (CAD).  Aneurysm detection. An aneurysm is a weak or damaged part of an artery wall that bulges out from the normal force of blood pumping through the body.  Heart size and shape. Changes in the size or shape of the heart can be associated with certain conditions, including heart failure, aneurysm, and CAD.  Heart muscle function.  Heart valve function.  Signs of a past heart attack.  Fluid buildup around the heart.  Thickening of the heart muscle.  A tumor or infectious growth around the heart valves. Tell a health care provider about:  Any allergies you have.  All medicines you are taking, including vitamins, herbs, eye drops, creams, and over-the-counter medicines.  Any blood disorders you have.  Any surgeries you have had.  Any medical conditions you have.  Whether you are pregnant or may be pregnant. What are the risks? Generally, this is a safe procedure. However, problems may occur, including:  Allergic reaction to dye (contrast) that may be used during the  procedure. What happens before the procedure? No specific preparation is needed. You may eat and drink normally. What happens during the procedure?   An IV tube may be inserted into one of your veins.  You may receive contrast through this tube. A contrast is an injection that improves the quality of the pictures from your heart.  A gel will be applied to your chest.  A wand-like tool (transducer)  will be moved over your chest. The gel will help to transmit the sound waves from the transducer.  The sound waves will harmlessly bounce off of your heart to allow the heart images to be captured in real-time motion. The images will be recorded on a computer. The procedure may vary among health care providers and hospitals. What happens after the procedure?  You may return to your normal, everyday life, including diet, activities, and medicines, unless your health care provider tells you not to do that. Summary  An echocardiogram is a procedure that uses painless sound waves (ultrasound) to produce an image of the heart.  Images from an echocardiogram can provide important information about the size and shape of your heart, heart muscle function, heart valve function, and fluid buildup around your heart.  You do not need to do anything to prepare before this procedure. You may eat and drink normally.  After the echocardiogram is completed, you may return to your normal, everyday life, unless your health care provider tells you not to do that. This information is not intended to replace advice given to you by your health care provider. Make sure you discuss any questions you have with your health care provider. Document Revised: 11/03/2018 Document Reviewed: 08/15/2016 Elsevier Patient Education  2020 [employer].

## 2019-09-14 ENCOUNTER — Encounter: Payer: Self-pay | Admitting: Internal Medicine

## 2019-09-14 DIAGNOSIS — R5383 Other fatigue: Secondary | ICD-10-CM | POA: Insufficient documentation

## 2019-09-14 DIAGNOSIS — R079 Chest pain, unspecified: Secondary | ICD-10-CM | POA: Insufficient documentation

## 2019-09-14 DIAGNOSIS — R0602 Shortness of breath: Secondary | ICD-10-CM | POA: Insufficient documentation

## 2019-10-09 ENCOUNTER — Other Ambulatory Visit: Payer: Self-pay

## 2019-10-09 ENCOUNTER — Ambulatory Visit
Admission: RE | Admit: 2019-10-09 | Discharge: 2019-10-09 | Disposition: A | Discharge status: Home / Self Care | Payer: Commercial Managed Care - PPO | Source: Ambulatory Visit | Attending: Internal Medicine | Admitting: Internal Medicine

## 2019-10-09 ENCOUNTER — Other Ambulatory Visit: Payer: Self-pay | Admitting: *Deleted

## 2019-10-09 ENCOUNTER — Other Ambulatory Visit
Admission: RE | Admit: 2019-10-09 | Discharge: 2019-10-09 | Disposition: A | Discharge status: Home / Self Care | Payer: Commercial Managed Care - PPO | Source: Ambulatory Visit | Attending: Internal Medicine | Admitting: Internal Medicine

## 2019-10-09 DIAGNOSIS — Z8249 Family history of ischemic heart disease and other diseases of the circulatory system: Secondary | ICD-10-CM | POA: Diagnosis not present

## 2019-10-09 DIAGNOSIS — I10 Essential (primary) hypertension: Secondary | ICD-10-CM | POA: Diagnosis not present

## 2019-10-09 DIAGNOSIS — R0789 Other chest pain: Secondary | ICD-10-CM

## 2019-10-09 DIAGNOSIS — R5382 Chronic fatigue, unspecified: Secondary | ICD-10-CM

## 2019-10-09 DIAGNOSIS — R0602 Shortness of breath: Secondary | ICD-10-CM | POA: Diagnosis not present

## 2019-10-09 LAB — CBC
HCT: 47.6 % (ref 39.0–52.0)
Hemoglobin: 16 g/dL (ref 13.0–17.0)
MCH: 28.4 pg (ref 26.0–34.0)
MCHC: 33.6 g/dL (ref 30.0–36.0)
MCV: 84.5 fL (ref 80.0–100.0)
Platelets: 215 10*3/uL (ref 150–400)
RBC: 5.63 MIL/uL (ref 4.22–5.81)
RDW: 13.6 % (ref 11.5–15.5)
WBC: 5.3 10*3/uL (ref 4.0–10.5)
nRBC: 0 % (ref 0.0–0.2)

## 2019-10-09 LAB — COMPREHENSIVE METABOLIC PANEL
ALT: 42 U/L (ref 0–44)
AST: 29 U/L (ref 15–41)
Albumin: 4.2 g/dL (ref 3.5–5.0)
Alkaline Phosphatase: 68 U/L (ref 38–126)
Anion gap: 5 (ref 5–15)
BUN: 16 mg/dL (ref 6–20)
CO2: 31 mmol/L (ref 22–32)
Calcium: 9.7 mg/dL (ref 8.9–10.3)
Chloride: 103 mmol/L (ref 98–111)
Creatinine, Ser: 0.95 mg/dL (ref 0.61–1.24)
GFR calc Af Amer: >60 mL/min (ref >60)
GFR calc non Af Amer: >60 mL/min (ref >60)
Glucose, Bld: 99 mg/dL (ref 70–99)
Potassium: 3.9 mmol/L (ref 3.5–5.1)
Sodium: 139 mmol/L (ref 135–145)
Total Bilirubin: 1 mg/dL (ref 0.3–1.2)
Total Protein: 7.7 g/dL (ref 6.5–8.1)

## 2019-10-09 LAB — TSH: TSH: 1.409 u[IU]/mL (ref 0.350–4.500)

## 2019-10-09 NOTE — Progress Notes (Signed)
*  PRELIMINARY RESULTS* Echocardiogram 2D Echocardiogram has been performed.  Howard Smith Jshon Ibe 10/09/2019, 10:12 AM

## 2019-10-10 ENCOUNTER — Telehealth: Payer: Self-pay

## 2019-10-10 NOTE — Telephone Encounter (Signed)
-----   Message from Yvonne Kendall, MD sent at 10/10/2019  2:18 PM EDT ----- Please let Mr. Matsuo know that his echo is normal.  His kidney function, liver function, electrolytes, blood counts, and thyroid function are also normal.  We will f/u as previously arranged to reassess his symptoms and determine the need for additional testing.

## 2019-10-10 NOTE — Telephone Encounter (Signed)
Spoke with patient regarding results and recommendation.  Patient verbalizes understanding and is agreeable to plan of care. Advised patient to call back with any issues or concerns.  

## 2019-10-18 ENCOUNTER — Ambulatory Visit: Payer: Commercial Managed Care - PPO | Admitting: Internal Medicine

## 2019-10-24 NOTE — Progress Notes (Signed)
Follow-up Outpatient Visit Date: 10/25/2019  Primary Care Provider: Guadalupe Maple, MD No address on file  Chief Complaint: Chest pain  HPI:  Howard Smith is a 54 y.o. male with history of hypertension, who presents for follow-up of chest pain and fatigue.  I met him last month, at which time he complained of poor energy and chest pain.  Prior workup at [facility] in 2018 was unremarkable (GXT and Holter monitor).  We agreed to obtain an echo and labs, which were normal.  Today, [name] reports that he has continued to have intermittent chest pain.  Happens a few times a week, usually on the right side.  He describes it as a pressure that lasts up to 30 minutes.  However, today, he feels like it has lasted a bit longer.  It is 3/10 in intensity and is not related to activity, including exertion or eating.  He notes it is distinctly different than what he has felt in the past with acid reflux.  He denies shortness of breath, palpitations, lightheadedness, and edema.  Home blood pressures are typically similar to what he had measured in the office today.  [name] continues to be quite fatigued.  He does not sleep well at night.  His wife has told him that he seems to breathe hard when he is asleep, though he does not have obvious snoring or apneic episodes.  He has never undergone sleep evaluation.  He notes that he does not feel refreshed when he wakes up in the morning.  Fatigue predates addition of metoprolol, which has not changed his symptoms much.  He was recently started on anastrozole due to low testosterone in an attempt to help improve his fatigue.  --------------------------------------------------------------------------------------------------  Cardiovascular History & Procedures: Cardiovascular Problems:  Atypical chest pain  Palpitation  Risk Factors:  Hypertension, obesity, and male gender  Cath/PCI:  None  CV Surgery:  None  EP Procedures and  Devices:  Holter monitor (01/28/2017, [facility]): Sinus rhythm with average heart rate of 77 bpm.  No supraventricular or ventricular ectopy.  Non-Invasive Evaluation(s):  Transthoracic echocardiogram (10/09/2019): Normal LV size with LVEF 60-65% and normal diastolic function.  Normal RV size and function.  No significant valvular abnormality.  Exercise tolerance test (02/03/2017, [facility]): Good exercise capacity without angina or EKG changes consistent with low risk study.  Recent CV Pertinent Labs: Lab Results  Component Value Date   CHOL 155 10/21/2016   HDL 38 (L) 10/21/2016   LDLCALC 95 10/21/2016   TRIG 111 10/21/2016   CHOLHDL 4.1 10/21/2016   K 3.9 10/09/2019   BUN 16 10/09/2019   BUN 19 10/21/2016   CREATININE 0.95 10/09/2019    Past medical and surgical history were reviewed and updated in EPIC.  Current Meds  Medication Sig  . anastrozole (ARIMIDEX) 1 MG tablet Take 1 mg by mouth once a week.  . hydrochlorothiazide (HYDRODIURIL) 25 MG tablet Take 25 mg by mouth daily.   . metoprolol succinate (TOPROL-XL) 25 MG 24 hr tablet Take 50 mg by mouth daily.   Marland Kitchen omeprazole (PRILOSEC) 20 MG capsule Take 20 mg by mouth daily.    Allergies: Patient has no known allergies.  Social History   Tobacco Use  . Smoking status: Never Smoker  . Smokeless tobacco: Never Used  Substance Use Topics  . Alcohol use: No    Alcohol/week: 0.0 standard drinks  . Drug use: No    Family History  Problem Relation Age of  Onset  . Cancer - Lung Father   . Heart failure Father   . Heart attack Father 46  . Bipolar disorder Mother   . Hyperlipidemia Mother   . Hypertension Mother   . Breast cancer Maternal Grandmother     Review of Systems: A 12-system review of systems was performed and was negative except as noted in the HPI.  --------------------------------------------------------------------------------------------------  Physical Exam: BP (!) 110/92 (BP  Location: Left Arm, Patient Position: Sitting, Cuff Size: Normal)   Pulse 71   Temp (!) 97.2 F (36.2 C)   Ht _0  (1.88 m)   Wt 237 lb 4 oz (107.6 kg)   BMI 30.46 kg/m   Repeat BP 116/84  General: NAD. Neck: No JVD or HJR. Lungs: Clear to auscultation bilaterally without wheezes or crackles. Heart: Regular rate and rhythm without murmurs, rubs, or gallops.  No chest wall tenderness. Abdomen: Soft, nontender, nondistended. Extremities: No lower extremity edema.  2+ radial and pedal pulses.  EKG: Normal sinus rhythm without abnormality.  Lab Results  Component Value Date   WBC 5.3 10/09/2019   HGB 16.0 10/09/2019   HCT 47.6 10/09/2019   MCV 84.5 10/09/2019   PLT 215 10/09/2019    Lab Results  Component Value Date   NA 139 10/09/2019   K 3.9 10/09/2019   CL 103 10/09/2019   CO2 31 10/09/2019   BUN 16 10/09/2019   CREATININE 0.95 10/09/2019   GLUCOSE 99 10/09/2019   ALT 42 10/09/2019    Lab Results  Component Value Date   CHOL 155 10/21/2016   HDL 38 (L) 10/21/2016   LDLCALC 95 10/21/2016   TRIG 111 10/21/2016   CHOLHDL 4.1 10/21/2016    --------------------------------------------------------------------------------------------------  ASSESSMENT AND PLAN: Chest pain: Location and timing of chest pain is not consistent with angina, though Mr. Dyke does describe it as a pressure-like sensation.  He has experienced this off and on for years, with exercise tolerance test a few years ago being low risk.  Cardiac risk factors include hypertension, male gender, and obesity.  Physical examination and EKG today are unremarkable.  We have agreed to proceed with cardiac CTA for further evaluation.  In the meantime, we will have Mr. Reamy start aspirin 81 mg daily.  I will have him take an additional metoprolol tartrate 25 mg daily on the morning of his CTA.  I advised him to seek immediate medical attention if his chest pain worsens.  Hypertension: Diastolic blood pressure  remains borderline elevated.  On recheck, it was in the 80s.  We will continue current regimen of HCTZ and metoprolol for now.  I encouraged Mr. Haber to continue to limit his sodium intake.  Fatigue and insomnia: This is been a longstanding.  Mr. Tober notes that his sleep has been poor and that he does not feel refreshed in the morning.  We will refer him for sleep study.  Follow-up: Return to clinic in 6 weeks.  Nelva Bush, MD 10/25/2019 8:05 AM

## 2019-10-25 ENCOUNTER — Ambulatory Visit (INDEPENDENT_AMBULATORY_CARE_PROVIDER_SITE_OTHER): Payer: Commercial Managed Care - PPO | Admitting: Internal Medicine

## 2019-10-25 ENCOUNTER — Encounter: Payer: Self-pay | Admitting: Internal Medicine

## 2019-10-25 ENCOUNTER — Other Ambulatory Visit: Payer: Self-pay

## 2019-10-25 VITALS — BP 110/92 | HR 71 | Temp 97.2°F | Ht 74.0 in | Wt 237.2 lb

## 2019-10-25 DIAGNOSIS — I10 Essential (primary) hypertension: Secondary | ICD-10-CM

## 2019-10-25 DIAGNOSIS — R079 Chest pain, unspecified: Secondary | ICD-10-CM | POA: Diagnosis not present

## 2019-10-25 DIAGNOSIS — G4700 Insomnia, unspecified: Secondary | ICD-10-CM

## 2019-10-25 DIAGNOSIS — R5383 Other fatigue: Secondary | ICD-10-CM | POA: Diagnosis not present

## 2019-10-25 MED ORDER — METOPROLOL TARTRATE 25 MG PO TABS: 25.0000 mg | ORAL_TABLET | Freq: Once | ORAL | 0 refills | Status: DC

## 2019-10-25 MED ORDER — ASPIRIN EC 81 MG PO TBEC: 81.0000 mg | DELAYED_RELEASE_TABLET | Freq: Every day | ORAL | 3 refills | Status: AC

## 2019-10-25 NOTE — Patient Instructions (Addendum)
Medication Instructions:  Your physician has recommended you make the following change in your medication:  1- START Aspirin 81 mg by mouth once a day. 2- PRIOR TO Cardiac CT for once extra dose - TAKE metoprolol tartrate 25 mg by mouth once a day about 2 hours prior to CT with a small sip of water.   *If you need a refill on your cardiac medications before your next appointment, please call your pharmacy*   Lab Work: none If you have labs (blood work) drawn today and your tests are completely normal, you will receive your results only by: Marland Kitchen MyChart Message (if you have MyChart) OR . A paper copy in the mail If you have any lab test that is abnormal or we need to change your treatment, we will call you to review the results.   Testing/Procedures: Your cardiac CT will be scheduled at one of the below locations:   [facility] [address] [place], [place] [address] [phone]  OR  [facility] [address] [place], [place] [address] [phone]  If scheduled at [facility], please arrive at the [facility] main entrance of [facility] 30 minutes prior to test start time. Proceed to the [facility] Radiology Department (first floor) to check-in and test prep.  If scheduled at [facility], please arrive 15 mins early for check-in and test prep.  Please follow these instructions carefully (unless otherwise directed):  Hold all erectile dysfunction medications at least 3 days (72 hrs) prior to test.  On the Night Before the Test: . Be sure to Drink plenty of water. . Do not consume any caffeinated/decaffeinated beverages or chocolate 12 hours prior to your test. . Do not take any antihistamines 12 hours prior to your test. . If you take Metformin do not take 24 hours prior to test.  On the Day of the Test: . Drink plenty of water. Do not drink any water within one hour of  the test. . Do not eat any food 4 hours prior to the test. . You may take your regular medications prior to the test.  . Take metoprolol (Lopressor) two hours prior to test. . HOLD Furosemide/Hydrochlorothiazide morning of the test.      After the Test: . Drink plenty of water. . After receiving IV contrast, you may experience a mild flushed feeling. This is normal. . On occasion, you may experience a mild rash up to 24 hours after the test. This is not dangerous. If this occurs, you can take Benadryl 25 mg and increase your fluid intake. . If you experience trouble breathing, this can be serious. If it is severe call 911 IMMEDIATELY. If it is mild, please call our office. . If you take any of these medications: Glipizide/Metformin, Avandament, Glucavance, please do not take 48 hours after completing test unless otherwise instructed.   Once we have confirmed authorization from your insurance company, we will call you to set up a date and time for your test.   For non-scheduling related questions, please contact the cardiac imaging nurse navigator should you have any questions/concerns: Rockwell Alexandria, RN Navigator Cardiac Imaging [facility] [phone] office  For scheduling needs, including cancellations and rescheduling, please call [phone].      Follow-Up: You have been referred to Pulmonology for sleep study evaluation for fatigue and insomnia. Please call [phone] if you do not receive a call to schedule  in about 7 days.    At [facility], you and your health needs are our priority.  As part of our continuing mission to provide you with exceptional heart care, we have created designated Provider Care Teams.  These Care Teams include your primary Cardiologist (physician) and Advanced Practice Providers (APPs -  Physician Assistants and Nurse Practitioners) who all work together to provide you with the care you need, when you need it.  We  recommend signing up for the patient portal called "MyChart".  Sign up information is provided on this After Visit Summary.  MyChart is used to connect with patients for Virtual Visits (Telemedicine).  Patients are able to view lab/test results, encounter notes, upcoming appointments, etc.  Non-urgent messages can be sent to your provider as well.   To learn more about what you can do with MyChart, go to [email or web address].    Your next appointment:   5 week(s)  The format for your next appointment:   In Person  Provider:    You may see DR Cristal Deer END or one of the following Advanced Practice Providers on your designated Care Team:    Nicolasa Ducking, NP  Eula Listen, PA-C  Marisue Ivan, PA-C    Cardiac CT Angiogram A cardiac CT angiogram is a procedure to look at the heart and the area around the heart. It may be done to help find the cause of chest pains or other symptoms of heart disease. During this procedure, a substance called contrast dye is injected into the blood vessels in the area to be checked. A large X-ray machine, called a CT scanner, then takes detailed pictures of the heart and the surrounding area. The procedure is also sometimes called a coronary CT angiogram, coronary artery scanning, or CTA. A cardiac CT angiogram allows the health care provider to see how well blood is flowing to and from the heart. The health care provider will be able to see if there are any problems, such as:  Blockage or narrowing of the coronary arteries in the heart.  Fluid around the heart.  Signs of weakness or disease in the muscles, valves, and tissues of the heart. Tell a health care provider about:  Any allergies you have. This is especially important if you have had a previous allergic reaction to contrast dye.  All medicines you are taking, including vitamins, herbs, eye drops, creams, and over-the-counter medicines.  Any blood disorders you have.  Any surgeries  you have had.  Any medical conditions you have.  Whether you are pregnant or may be pregnant.  Any anxiety disorders, chronic pain, or other conditions you have that may increase your stress or prevent you from lying still. What are the risks? Generally, this is a safe procedure. However, problems may occur, including:  Bleeding.  Infection.  Allergic reactions to medicines or dyes.  Damage to other structures or organs.  Kidney damage from the contrast dye that is used.  Increased risk of cancer from radiation exposure. This risk is low. Talk with your health care provider about: ? The risks and benefits of testing. ? How you can receive the lowest dose of radiation. What happens before the procedure?  Wear comfortable clothing and remove any jewelry, glasses, dentures, and hearing aids.  Follow instructions from your health care provider about eating and drinking. This may include: ? For 12 hours before the procedure -- avoid caffeine. This includes tea, coffee, soda, energy drinks, and diet pills. Drink plenty of water  or other fluids that do not have caffeine in them. Being well hydrated can prevent complications. ? For 4-6 hours before the procedure -- stop eating and drinking. The contrast dye can cause nausea, but this is less likely if your stomach is empty.  Ask your health care provider about changing or stopping your regular medicines. This is especially important if you are taking diabetes medicines, blood thinners, or medicines to treat problems with erections (erectile dysfunction). What happens during the procedure?   Hair on your chest may need to be removed so that small sticky patches called electrodes can be placed on your chest. These will transmit information that helps to monitor your heart during the procedure.  An IV will be inserted into one of your veins.  You might be given a medicine to control your heart rate during the procedure. This will help to  ensure that good images are obtained.  You will be asked to lie on an exam table. This table will slide in and out of the CT machine during the procedure.  Contrast dye will be injected into the IV. You might feel warm, or you may get a metallic taste in your mouth.  You will be given a medicine called nitroglycerin. This will relax or dilate the arteries in your heart.  The table that you are lying on will move into the CT machine tunnel for the scan.  The person running the machine will give you instructions while the scans are being done. You may be asked to: ? Keep your arms above your head. ? Hold your breath. ? Stay very still, even if the table is moving.  When the scanning is complete, you will be moved out of the machine.  The IV will be removed. The procedure may vary among health care providers and hospitals. What can I expect after the procedure? After your procedure, it is common to have:  A metallic taste in your mouth from the contrast dye.  A feeling of warmth.  A headache from the nitroglycerin. Follow these instructions at home:  Take over-the-counter and prescription medicines only as told by your health care provider.  If you are told, drink enough fluid to keep your urine pale yellow. This will help to flush the contrast dye out of your body.  Most people can return to their normal activities right after the procedure. Ask your health care provider what activities are safe for you.  It is up to you to get the results of your procedure. Ask your health care provider, or the department that is doing the procedure, when your results will be ready.  Keep all follow-up visits as told by your health care provider. This is important. Contact a health care provider if:  You have any symptoms of allergy to the contrast dye. These include: ? Shortness of breath. ? Rash or hives. ? A racing heartbeat. Summary  A cardiac CT angiogram is a procedure to look at  the heart and the area around the heart. It may be done to help find the cause of chest pains or other symptoms of heart disease.  During this procedure, a large X-ray machine, called a CT scanner, takes detailed pictures of the heart and the surrounding area after a contrast dye has been injected into blood vessels in the area.  Ask your health care provider about changing or stopping your regular medicines before the procedure. This is especially important if you are taking diabetes medicines, blood thinners,  or medicines to treat erectile dysfunction.  If you are told, drink enough fluid to keep your urine pale yellow. This will help to flush the contrast dye out of your body. This information is not intended to replace advice given to you by your health care provider. Make sure you discuss any questions you have with your health care provider. Document Revised: 03/08/2019 Document Reviewed: 03/08/2019 Elsevier Patient Education  2020 [employer].

## 2019-11-13 NOTE — Telephone Encounter (Signed)
Spoke with patient. Reports yesterday starting around 4 pm he started feeling pain on the left side of his neck under jaw and down to shoulder. States "feels like been beat but doesn't think I slept on it wrong the night before because I didn't feel it until the afternoon." Denies chest pain, shortness of breath, sweating, N/V, left arm pain, or indigestion. It is also sore when he presses on it. He played golf yesterday prior to the pain beginning but says he plays golf all the time and doesn't see how that could have caused the pain. He has not tried Tylenol or any pain reliever yet.  Advised patient to try some Tylenol and see if this helps the pain.  Let us know in the next 24 hours if no improvement. Pt verbalized understanding to call 911 or go to the emergency room, if he develops any new or worsening symptoms.  Routing to Dr End for review and any further recommendations.

## 2019-11-30 ENCOUNTER — Ambulatory Visit: Payer: Commercial Managed Care - PPO | Admitting: Internal Medicine

## 2019-12-15 ENCOUNTER — Telehealth (HOSPITAL_COMMUNITY): Payer: Self-pay | Admitting: *Deleted

## 2019-12-15 NOTE — Telephone Encounter (Signed)
Attempted to call patient's wife regarding upcoming cardiac CT appointment.Pt request to give his wife a call to go over instructions. Left message on voicemail with name and call back number  [place] RN Navigator Cardiac [facility] [phone] Office [phone] Cell

## 2019-12-18 ENCOUNTER — Other Ambulatory Visit: Payer: Self-pay

## 2019-12-18 ENCOUNTER — Encounter: Payer: 59 | Admitting: *Deleted

## 2019-12-18 ENCOUNTER — Ambulatory Visit (HOSPITAL_COMMUNITY)
Admission: RE | Admit: 2019-12-18 | Discharge: 2019-12-18 | Disposition: A | Discharge status: Home / Self Care | Payer: 59 | Source: Ambulatory Visit | Attending: Internal Medicine | Admitting: Internal Medicine

## 2019-12-18 DIAGNOSIS — R079 Chest pain, unspecified: Secondary | ICD-10-CM

## 2019-12-18 DIAGNOSIS — R5383 Other fatigue: Secondary | ICD-10-CM

## 2019-12-18 DIAGNOSIS — Z006 Encounter for examination for normal comparison and control in clinical research program: Secondary | ICD-10-CM

## 2019-12-18 IMAGING — CT CT HEART MORP W/ CTA COR W/ SCORE W/ CA W/CM &/OR W/O CM
4 of 7 series · 8 of 20 positions shown, 9 images · IV contrast (APPLIED)
Comparison: 12/26/2018
COMPARISON: 12/26/2018

Addendum:
EXAM:
OVER-READ INTERPRETATION  CT CHEST

The following report is an over-read performed by radiologist Dr.
Djahar Bu [REDACTED] on 12/18/2019. This over-read
does not include interpretation of cardiac or coronary anatomy or
pathology. The coronary CTA interpretation by the cardiologist is
attached.
TECHNIQUE: The patient was scanned on a Phillips Force scanner.

[Series 6: best diast 73 % · axial · 0.41mm/px · z∈[+72,+120]mm · 2 of 361 slices shown, 3 images]
[im 121/361  vessel]
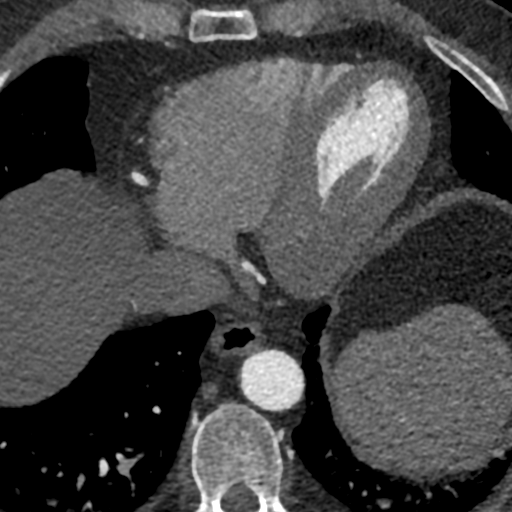
[im 121/361  lung]
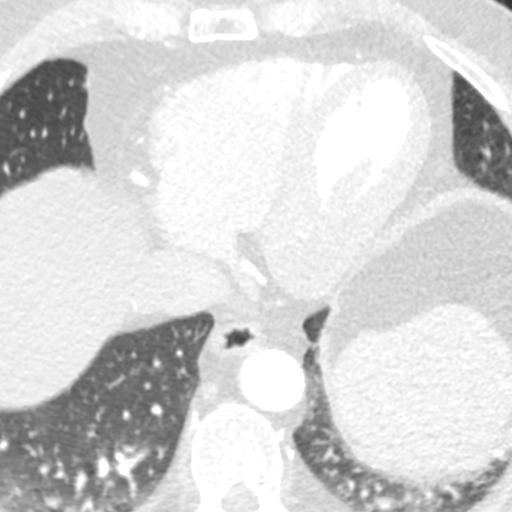
[im 241/361  vessel]
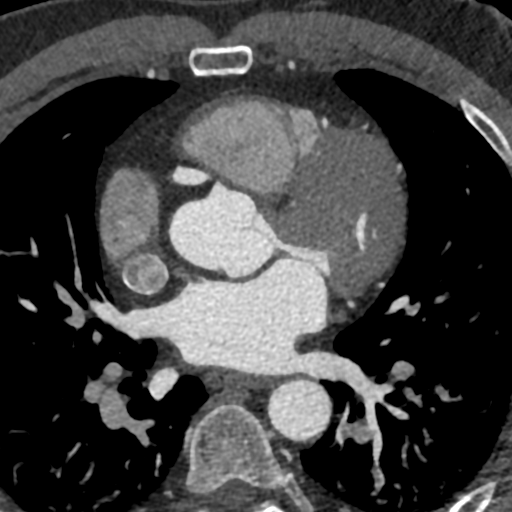

[Series 7: best syst 44 % · axial · 0.41mm/px · z∈[+72,+120]mm · 2 of 361 slices shown]
[im 121/361  vessel]
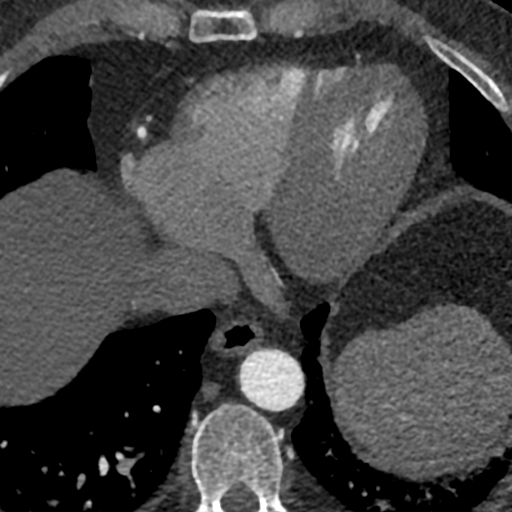
[im 241/361  vessel]
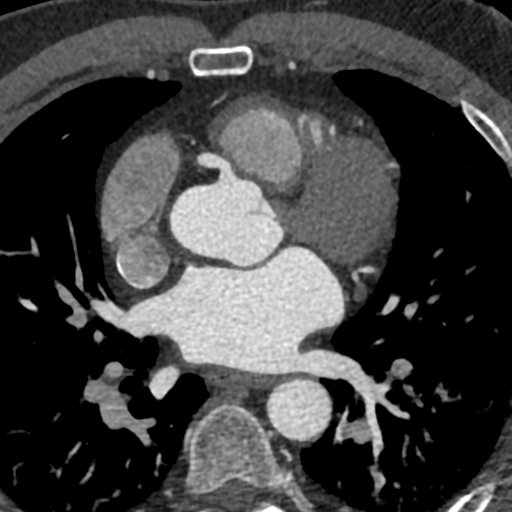

[Series 8: ts diast sharp 73 % · axial · 0.41mm/px · z∈[+72,+120]mm · 2 of 361 slices shown]
[im 121/361  lung]
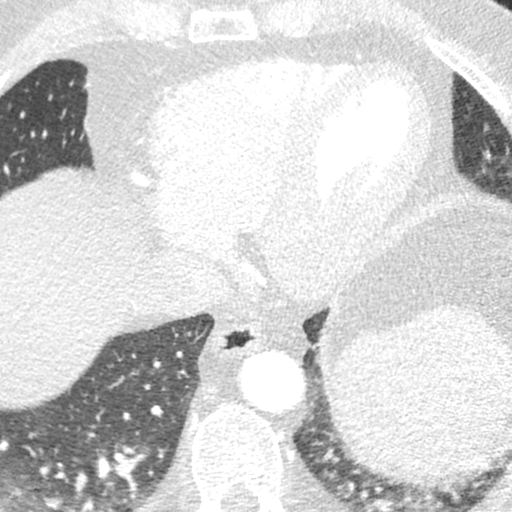
[im 241/361  lung]
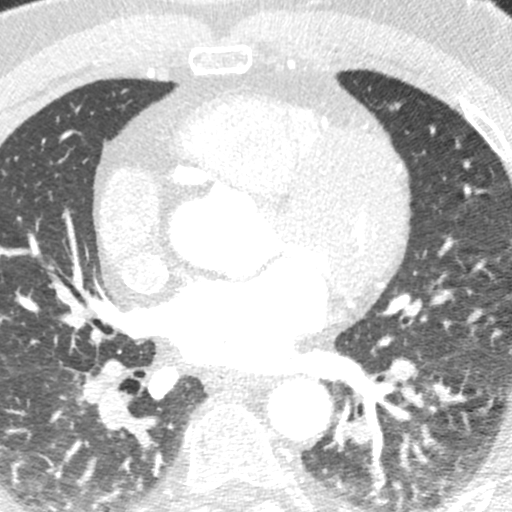

[Series 9: ts syst sharp 44 % · axial · 0.41mm/px · z∈[+72,+120]mm · 2 of 361 slices shown]
[im 121/361  lung]
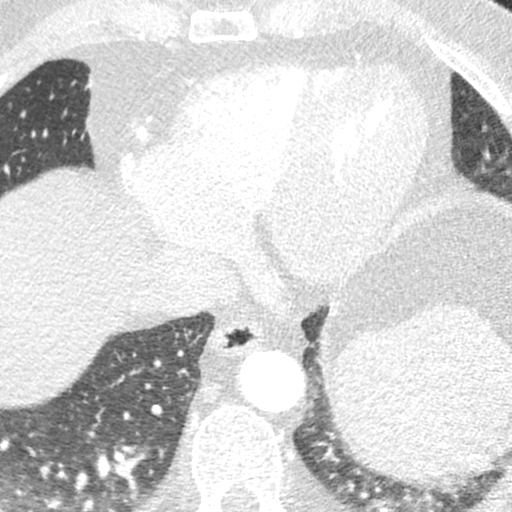
[im 241/361  lung]
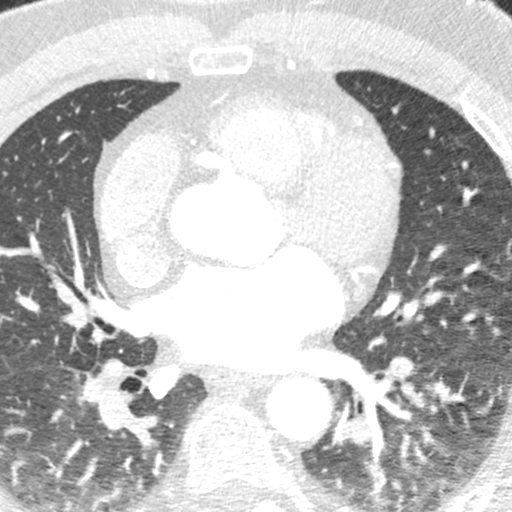

[8 of 20 positions shown; findings below may reference images not displayed]

FINDINGS: Vascular: Heart is normal size.  Visualized aorta normal caliber.

Mediastinum/Nodes: No adenopathy in the lower mediastinum or hila.

Lungs/Pleura: No confluent opacities or effusions.

Upper Abdomen: Imaging into the upper abdomen shows no acute
findings.

Musculoskeletal: Chest wall soft tissues are unremarkable. No acute
bony abnormality.
IMPRESSION: No acute or significant extracardiac abnormality.

EXAM:
Cardiac/Coronary  CT
FINDINGS: A 120 kV prospective scan was triggered in the descending thoracic
aorta at 111 HU's. Axial non-contrast 3 mm slices were carried out
through the heart. The data set was analyzed on a dedicated work
station and scored using the Agatson method. Gantry rotation speed
was 250 msecs and collimation was .6 mm. No beta blockade and 0.8 mg
of sl NTG was given. The 3D data set was reconstructed in 5%
intervals of the 67-82 % of the R-R cycle. Diastolic phases were
analyzed on a dedicated work station using MPR, MIP and VRT modes.
The patient received 80 cc of contrast.

Aorta:  Normal size.  No calcifications.  No dissection.

Aortic Valve:  Trileaflet.  No calcifications.

Coronary Arteries:  Normal coronary origin.  Right dominance.

RCA is a large dominant artery that gives rise to PDA and PLVB.
There is no plaque.

Left main is a large artery that gives rise to LAD and LCX arteries.
There is no plaque.

LAD is a large vessel that gives rise to a moderate sized D1. There
is minimal calcified plaque in the proximal to mid LAD with
associated stenosis of 0-24%. There is mlid noncalcified plaque in
the mid LAD with associated stenosis of 25-49%.

LCX is a non-dominant artery that gives rise to one moderate sized
branching OM1 branch. There is no plaque.

Other findings:

Normal pulmonary vein drainage into the left atrium.

Normal let atrial appendage without a thrombus.

Normal size of the pulmonary artery.
IMPRESSION: 1. Coronary calcium score of 20. This was 68th percentile for age
and sex matched control.

2.  Normal coronary origin with right dominance.

3.  Mild atherosclerosis of the LAD.  CAD-RADS 2.

4.  Consider non-athersclerotic causes of chest pain.

5.  Recommend preventive therapy and risk factor modification.

6.  This study has been sent for FFR analysis of the LAD.

Ourari Tiger

*** [place] of Addendum ***
EXAM:
OVER-READ INTERPRETATION  CT CHEST

The following report is an over-read performed by radiologist Dr.
Djahar Bu [REDACTED] on 12/18/2019. This over-read
does not include interpretation of cardiac or coronary anatomy or
pathology. The coronary CTA interpretation by the cardiologist is
attached.
FINDINGS: Vascular: Heart is normal size.  Visualized aorta normal caliber.

Mediastinum/Nodes: No adenopathy in the lower mediastinum or hila.

Lungs/Pleura: No confluent opacities or effusions.

Upper Abdomen: Imaging into the upper abdomen shows no acute
findings.

Musculoskeletal: Chest wall soft tissues are unremarkable. No acute
bony abnormality.
IMPRESSION: No acute or significant extracardiac abnormality.

## 2019-12-18 MED ORDER — NITROGLYCERIN 0.4 MG SL SUBL
SUBLINGUAL_TABLET | SUBLINGUAL | Status: AC
  Filled 2019-12-18: qty 2

## 2019-12-18 MED ORDER — METOPROLOL TARTRATE 5 MG/5ML IV SOLN: 5.0000 mg | INTRAVENOUS | Status: DC | PRN

## 2019-12-18 MED ORDER — METOPROLOL TARTRATE 5 MG/5ML IV SOLN
INTRAVENOUS | Status: AC
  Administered 2019-12-18: 5 mg
  Filled 2019-12-18: qty 10

## 2019-12-18 MED ORDER — IOHEXOL 350 MG/ML SOLN
80.0000 mL | Freq: Once | INTRAVENOUS | Status: AC | PRN
  Administered 2019-12-18: 80 mL via INTRAVENOUS

## 2019-12-18 MED ORDER — NITROGLYCERIN 0.4 MG SL SUBL
0.8000 mg | SUBLINGUAL_TABLET | Freq: Once | SUBLINGUAL | Status: AC
  Administered 2019-12-18: 0.8 mg via SUBLINGUAL

## 2019-12-18 NOTE — Progress Notes (Signed)
CT scan completed. Tolerated well. D/C home ambulatory, awake and alert. In no distress. 

## 2019-12-18 NOTE — Research (Signed)
Subject Name: Howard Smith.  Subject met inclusion and exclusion criteria.  The informed consent form, study requirements and expectations were reviewed with the subject and questions and concerns were addressed prior to the signing of the consent form.  The subject verbalized understanding of the trial requirements.  The subject agreed to participate in the CADFEM trial and signed the informed consent at 0700 on 12/18/19  The informed consent was obtained prior to performance of any protocol-specific procedures for the subject.  A copy of the signed informed consent was given to the subject and a copy was placed in the subject's medical record.   [name]

## 2019-12-19 ENCOUNTER — Encounter: Payer: Self-pay | Admitting: Adult Health

## 2019-12-19 ENCOUNTER — Ambulatory Visit: Payer: 59 | Admitting: Adult Health

## 2019-12-19 VITALS — BP 120/74 | HR 73 | Temp 98.6°F | Ht 74.0 in | Wt 239.6 lb

## 2019-12-19 DIAGNOSIS — R400 Somnolence: Secondary | ICD-10-CM | POA: Diagnosis not present

## 2019-12-19 DIAGNOSIS — E669 Obesity, unspecified: Secondary | ICD-10-CM | POA: Diagnosis not present

## 2019-12-19 NOTE — Progress Notes (Signed)
@Patient  ID: ., male    DOB: 06/14/1966, 54 y.o.   MRN: [id number]  Chief Complaint  Patient presents with  . Sleep Consult    possible sleep apnea     Referring provider: [id number], MD  HPI: 54 year old male never smoker presents for sleep consult Dec 19, 2019 for daytime sleepiness and restless sleep Medical history significant for hypertension  TEST/EVENTS :   12/19/2019 Sleep Consult  Patient presents today for a sleep consult.  Patient says that he was recently seen at cardiology for a work-up for atypical chest pain.  Was mentioning that he has restless sleep and feels unrefreshed upon awakening along with daytime sleepiness.  He was referred for work-up for possible underlying sleep apnea.  Patient says he typically goes to bed every night about 10 to 11:00 at night gets up each morning about 630.  Does not take naps.  Says he takes them only about 10 to 15 minutes to go to sleep.  But is up multiple times throughout the night.  Says he typically only gets about 2 to 4 hours of sleep each night because he wakes up and cannot go back to sleep.  Patient's weight has been somewhat stable but is maybe up 10 to 15 pounds over the last few years.  Patient says he is very active he is a 12/21/2019 and has a lot of physical labor with his job.  He denies any TMJ, teeth grinding or clenching, sleep paralysis or narcolepsy symptoms.  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  Does watch TV prior to going to bed. Says he feels tired upon awakening never feels refreshed.  Does have some daytime sleepiness but very seldom takes a nap.  Feels fatigued with low energy. Epworth score 3.  Denies any known snoring or report of snoring from his spouse.  His spouse does report heavy breathing.   No Known Allergies  Immunization History  Administered Date(s) Administered  . Influenza-Unspecified 03/28/2015, 10/30/2019    Past Medical History:  Diagnosis Date  .  Heartburn   . Hypertension   Low testosterone  Surgical history no known surgeries  Social history.  Patient is married.  Never smoker..  Patient is a 12/30/2019.  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  Family history positive for asthma heart disease and cancer in his father.  Tobacco History: Social History   Tobacco Use  Smoking Status Never Smoker  Smokeless Tobacco Never Used   Counseling given: Not Answered   Outpatient Medications Prior to Visit  Medication Sig Dispense Refill  . anastrozole (ARIMIDEX) 1 MG tablet Take 1 mg by mouth once a week.    Proofreader aspirin EC 81 MG tablet Take 1 tablet (81 mg total) by mouth daily. 90 tablet 3  . omeprazole (PRILOSEC) 20 MG capsule Take 20 mg by mouth daily.    . hydrochlorothiazide (HYDRODIURIL) 25 MG tablet Take 25 mg by mouth daily.     . metoprolol succinate (TOPROL-XL) 25 MG 24 hr tablet Take 50 mg by mouth daily.     . metoprolol tartrate (LOPRESSOR) 25 MG tablet Take 1 tablet (25 mg total) by mouth once for 1 dose. Take 2 hours prior to CT with a small sip of water. 1 tablet 0   No facility-administered medications prior to visit.     Review of Systems:   Constitutional:   No  weight loss, night  sweats,  Fevers, chills, fatigue, or  lassitude.  HEENT:   No headaches,  Difficulty swallowing,  Tooth/dental problems, or  Sore throat,                No sneezing, itching, ear ache, nasal congestion, post nasal drip,   CV:    Orthopnea, PND, swelling in lower extremities, anasarca, dizziness, palpitations, syncope.   GI  No heartburn, indigestion, abdominal pain, nausea, vomiting, diarrhea, change in bowel habits, loss of appetite, bloody stools.   Resp: No shortness of breath with exertion or at rest.  No excess mucus, no productive cough,  No non-productive cough,  No coughing up of blood.  No change in color of mucus.  No wheezing.  No chest wall deformity  Skin: no rash or lesions.  GU: no  dysuria, change in color of urine, no urgency or frequency.  No flank pain, no hematuria   MS:  No joint pain or swelling.  No decreased range of motion.  No back pain.    Physical Exam  BP 120/74 (BP Location: Left Arm, Cuff Size: Normal)   Pulse 73   Temp 98.6 F (37 C) (Oral)   Ht 6\' 2"  (1.88 m)   Wt 239 lb 9.6 oz (108.7 kg)   BMI 30.76 kg/m   GEN: A/Ox3; pleasant , NAD, well nourished    HEENT:  [place]/AT,  NOSE-clear, THROAT-clear, no lesions, no postnasal drip or exudate noted.  Class III MP airway  NECK:  Supple w/ fair ROM; no JVD; normal carotid impulses w/o bruits; no thyromegaly or nodules palpated; no lymphadenopathy.    RESP  Clear  P & A; w/o, wheezes/ rales/ or rhonchi. no accessory muscle use, no dullness to percussion  CARD:  RRR, no m/r/g, no peripheral edema, pulses intact, no cyanosis or clubbing.  GI:   Soft & nt; nml bowel sounds; no organomegaly or masses detected.   Musco: Warm bil, no deformities or joint swelling noted.   Neuro: alert, no focal deficits noted.    Skin: Warm, no lesions or rashes    Lab Results:  CBC    Component Value Date/Time   WBC 5.3 10/09/2019 1014   RBC 5.63 10/09/2019 1014   HGB 16.0 10/09/2019 1014   HGB 15.8 10/21/2016 0821   HCT 47.6 10/09/2019 1014   HCT 46.3 10/21/2016 0821   PLT 215 10/09/2019 1014   PLT 226 10/21/2016 0821   MCV 84.5 10/09/2019 1014   MCV 85 10/21/2016 0821   MCH 28.4 10/09/2019 1014   MCHC 33.6 10/09/2019 1014   RDW 13.6 10/09/2019 1014   RDW 13.8 10/21/2016 0821   LYMPHSABS 2.1 10/21/2016 0821   EOSABS 0.1 10/21/2016 0821   BASOSABS 0.0 10/21/2016 0821    BMET    Component Value Date/Time   NA 139 10/09/2019 1014   NA 140 10/21/2016 0821   K 3.9 10/09/2019 1014   CL 103 10/09/2019 1014   CO2 31 10/09/2019 1014   GLUCOSE 99 10/09/2019 1014   BUN 16 10/09/2019 1014   BUN 19 10/21/2016 0821   CREATININE 0.95 10/09/2019 1014   CALCIUM 9.7 10/09/2019 1014   GFRNONAA >60  10/09/2019 1014   GFRAA >60 10/09/2019 1014    BNP No results found for: BNP  ProBNP No results found for: PROBNP  Imaging:     No flowsheet data found.  No results found for: NITRICOXIDE      Assessment & Plan:   Daytime sleepiness Mr. 10/11/2019  is a very pleasant gentleman.  Appreciate sleep referral.  He has  Daytime sleepiness with risk factors for obstructive sleep apnea including BMI of 30, restless sleep, daytime sleepiness, low energy. Patient education given.  Discussed possible sleep evaluations will proceed with a home sleep study. Discussed a healthy sleep regimen.   Plan  Patient Instructions  We are setting you up for a home sleep study  Healthy sleep regimen  Follow up with Dr. Mortimer Fries or NP on return in 3 months and As needed       Obesity (BMI 30.0-34.9) BMI of 30.  Patient is encouraged on healthy weight     Rexene Edison, NP 12/19/2019

## 2019-12-19 NOTE — Patient Instructions (Signed)
We are setting you up for a home sleep study  Healthy sleep regimen  Follow up with Dr. Belia Heman or NP on return in 3 months and As needed

## 2019-12-19 NOTE — Progress Notes (Signed)
Reviewed and agree with assessment/plan.   Coralyn Helling, MD [facility] 12/19/2019, 3:22 PM Pager:  [phone]

## 2019-12-19 NOTE — Assessment & Plan Note (Addendum)
Howard Smith is a very pleasant gentleman.  Appreciate sleep referral.  He has  Daytime sleepiness with risk factors for obstructive sleep apnea including BMI of 30, restless sleep, daytime sleepiness, low energy. Patient education given.  Discussed possible sleep evaluations will proceed with a home sleep study. Discussed a healthy sleep regimen.   Plan  Patient Instructions  We are setting you up for a home sleep study  Healthy sleep regimen  Follow up with Dr. Belia Heman or NP on return in 3 months and As needed

## 2019-12-19 NOTE — Assessment & Plan Note (Signed)
BMI of 30.  Patient is encouraged on healthy weight

## 2019-12-22 ENCOUNTER — Ambulatory Visit (INDEPENDENT_AMBULATORY_CARE_PROVIDER_SITE_OTHER): Payer: 59 | Admitting: Internal Medicine

## 2019-12-22 ENCOUNTER — Other Ambulatory Visit: Payer: Self-pay

## 2019-12-22 ENCOUNTER — Encounter: Payer: Self-pay | Admitting: Internal Medicine

## 2019-12-22 VITALS — BP 120/100 | HR 77 | Ht 74.0 in | Wt 238.0 lb

## 2019-12-22 DIAGNOSIS — Z13220 Encounter for screening for lipoid disorders: Secondary | ICD-10-CM | POA: Diagnosis not present

## 2019-12-22 DIAGNOSIS — R5383 Other fatigue: Secondary | ICD-10-CM | POA: Diagnosis not present

## 2019-12-22 DIAGNOSIS — I10 Essential (primary) hypertension: Secondary | ICD-10-CM

## 2019-12-22 DIAGNOSIS — Z79899 Other long term (current) drug therapy: Secondary | ICD-10-CM | POA: Diagnosis not present

## 2019-12-22 MED ORDER — LISINOPRIL 5 MG PO TABS: 5.0000 mg | ORAL_TABLET | Freq: Every day | ORAL | 3 refills | Status: DC

## 2019-12-22 NOTE — Patient Instructions (Signed)
Medication Instructions:  - Your physician has recommended you make the following change in your medication:   1) START lisinopril 5 mg- take 1 tablet by mouth once daily   *If you need a refill on your cardiac medications before your next appointment, please call your pharmacy*   Lab Work: - Your physician recommends that you return for FASTING lab work in: 2 weeks (to be done in office)- Lipid/ CMET/ Iron Panel  If you have labs (blood work) drawn today and your tests are completely normal, you will receive your results only by: Marland Kitchen MyChart Message (if you have MyChart) OR . A paper copy in the mail If you have any lab test that is abnormal or we need to change your treatment, we will call you to review the results.   Testing/Procedures: - none ordered   Follow-Up: At [facility], you and your health needs are our priority.  As part of our continuing mission to provide you with exceptional heart care, we have created designated Provider Care Teams.  These Care Teams include your primary Cardiologist (physician) and Advanced Practice Providers (APPs -  Physician Assistants and Nurse Practitioners) who all work together to provide you with the care you need, when you need it.  We recommend signing up for the patient portal called "MyChart".  Sign up information is provided on this After Visit Summary.  MyChart is used to connect with patients for Virtual Visits (Telemedicine).  Patients are able to view lab/test results, encounter notes, upcoming appointments, etc.  Non-urgent messages can be sent to your provider as well.   To learn more about what you can do with MyChart, go to [email or web address].    Your next appointment:   6 week(s)  The format for your next appointment:   In Person  Provider:    You may see Yvonne Kendall, MD or one of the following Advanced Practice Providers on your designated Care Team:    Nicolasa Ducking, NP  Eula Listen, PA-C  Marisue Ivan, PA-C    Other Instructions  Lisinopril Tablets What is this medicine? LISINOPRIL (lyse [place] oh pril) is an ACE inhibitor. It treats high blood pressure and heart failure. It can treat heart damage after a heart attack. This medicine may be used for other purposes; ask your health care provider or pharmacist if you have questions. COMMON BRAND NAME(S): Prinivil, Zestril What should I tell my health care provider before I take this medicine? They need to know if you have any of these conditions:  diabetes  heart or blood vessel disease  kidney disease  low blood pressure  previous swelling of the tongue, face, or lips with difficulty breathing, difficulty swallowing, hoarseness, or tightening of the throat  an unusual or allergic reaction to lisinopril, other ACE inhibitors, insect venom, foods, dyes, or preservatives  pregnant or trying to get pregnant  breast-feeding How should I use this medicine? Take this drug by mouth. Take it as directed on the prescription label at the same time every day. You can take it with or without food. If it upsets your stomach, take it with food. Keep taking it unless your health care provider tells you to stop. Talk to your health care provider about the use of this drug in children. While it may be prescribed for children as young as 6 for selected conditions, precautions do apply. Overdosage: If you think you have taken too much of this medicine contact a poison control center or emergency room  at once. NOTE: This medicine is only for you. Do not share this medicine with others. What if I miss a dose? If you miss a dose, take it as soon as you can. If it is almost time for your next dose, take only that dose. Do not take double or extra doses. What may interact with this medicine? Do not take this medicine with any of the following medications:  hymenoptera venom  sacubitril; valsartan This medicines may also interact with the  following medications:  aliskiren  angiotensin receptor blockers, like losartan or valsartan  certain medicines for diabetes  diuretics  everolimus  gold compounds  lithium  NSAIDs, medicines for pain and inflammation, like ibuprofen or naproxen  potassium salts or supplements  salt substitutes  sirolimus  temsirolimus This list may not describe all possible interactions. Give your health care provider a list of all the medicines, herbs, non-prescription drugs, or dietary supplements you use. Also tell them if you smoke, drink alcohol, or use illegal drugs. Some items may interact with your medicine. What should I watch for while using this medicine? Visit your doctor or health care professional for regular check ups. Check your blood pressure as directed. Ask your doctor what your blood pressure should be, and when you should contact him or her. Do not treat yourself for coughs, colds, or pain while you are using this medicine without asking your doctor or health care professional for advice. Some ingredients may increase your blood pressure. Women should inform their doctor if they wish to become pregnant or think they might be pregnant. There is a potential for serious side effects to an unborn child. Talk to your health care professional or pharmacist for more information. Check with your doctor or health care professional if you get an attack of severe diarrhea, nausea and vomiting, or if you sweat a lot. The loss of too much body fluid can make it dangerous for you to take this medicine. You may get drowsy or dizzy. Do not drive, use machinery, or do anything that needs mental alertness until you know how this drug affects you. Do not stand or sit up quickly, especially if you are an older patient. This reduces the risk of dizzy or fainting spells. Alcohol can make you more drowsy and dizzy. Avoid alcoholic drinks. Avoid salt substitutes unless you are told otherwise by your  doctor or health care professional. What side effects may I notice from receiving this medicine? Side effects that you should report to your doctor or health care professional as soon as possible:  allergic reactions like skin rash, itching or hives, swelling of the hands, feet, face, lips, throat, or tongue  breathing problems  signs and symptoms of kidney injury like trouble passing urine or change in the amount of urine  signs and symptoms of increased potassium like muscle weakness; chest pain; or fast, irregular heartbeat  signs and symptoms of liver injury like dark yellow or brown urine; general ill feeling or flu-like symptoms; light-colored stools; loss of appetite; nausea; right upper belly pain; unusually weak or tired; yellowing of the eyes or skin  signs and symptoms of low blood pressure like dizziness; feeling faint or lightheaded, falls; unusually weak or tired  stomach pain with or without nausea and vomiting Side effects that usually do not require medical attention (report to your doctor or health care professional if they continue or are bothersome):  changes in taste  cough  dizziness  fever  headache  sensitivity  to light This list may not describe all possible side effects. Call your doctor for medical advice about side effects. You may report side effects to FDA at 1-800-FDA-1088. Where should I keep my medicine? Keep out of the reach of children and pets. Store at room temperature between 20 and 25 degrees C (68 and 77 degrees F). Protect from moisture. Keep the container tightly closed. Do not freeze. Avoid exposure to extreme heat. Throw away any unused drug after the expiration date. NOTE: This sheet is a summary. It may not cover all possible information. If you have questions about this medicine, talk to your doctor, pharmacist, or health care provider.  2020 Elsevier/Gold Standard (2019-02-15 11:38:35)

## 2019-12-22 NOTE — Progress Notes (Signed)
Follow-up Outpatient Visit Date: 12/22/2019  Primary Care Provider: Steele Sizer, MD No address on file  Chief Complaint: Fatigue  HPI:  Howard Smith is a 54 y.o. male with history of hypertension, who presents for follow-up of fatigue and chest pain.  I last saw him in late March, at which time he continued to have significant fatigue as well as intermittent right-sided chest pain.  He underwent cardiac CTA earlier this week, which showed mild to moderate, nonobstructive disease.  Today, Mr. Brahmbhatt reports feeling about the same as when I last saw him.  He is still tired most days.  He has not had much chest pain and is tolerating his current regimen of metoprolol and HCTZ well.  He has not had any lightheadedness/dizziness or palpitations.  Chronic ankle edema is unchanged.  He was evaluated by pulmonology earlier this week and is currently awaiting insurance approval for a sleep study.  --------------------------------------------------------------------------------------------------  Cardiovascular History & Procedures: Cardiovascular Problems:  Atypical chest pain  Palpitation  Risk Factors:  Hypertension, obesity, and male gender  Cath/PCI:  None  CV Surgery:  None  EP Procedures and Devices:  Holter monitor (01/28/2017, [facility]): Sinus rhythm with average heart rate of 77 bpm. No supraventricular or ventricular ectopy.  Non-Invasive Evaluation(s):  Cardiac CTA (12/18/2019): Normal coronary origins with right dominance.  Mild to moderate atherosclerosis of the proximal and mid LAD (less than 50%).  Coronary calcium score 20 (68th percentile for age and sex matched controls).  No significant cardiac findings.  Transthoracic echocardiogram (10/09/2019): Normal LV size with LVEF 60-65% and normal diastolic function.  Normal RV size and function.  No significant valvular abnormality.  Exercise tolerance test (02/03/2017, [facility]): Good exercise capacity  without angina or EKG changes consistent with low risk study.  Recent CV Pertinent Labs: Lab Results  Component Value Date   CHOL 155 10/21/2016   HDL 38 (L) 10/21/2016   LDLCALC 95 10/21/2016   TRIG 111 10/21/2016   CHOLHDL 4.1 10/21/2016   K 3.9 10/09/2019   BUN 16 10/09/2019   BUN 19 10/21/2016   CREATININE 0.95 10/09/2019    Past medical and surgical history were reviewed and updated in EPIC.  Current Meds  Medication Sig  . anastrozole (ARIMIDEX) 1 MG tablet Take 1 mg by mouth once a week.  Marland Kitchen aspirin EC 81 MG tablet Take 1 tablet (81 mg total) by mouth daily.  . hydrochlorothiazide (HYDRODIURIL) 25 MG tablet Take 25 mg by mouth daily.   . metoprolol succinate (TOPROL-XL) 25 MG 24 hr tablet Take 50 mg by mouth daily.   Marland Kitchen omeprazole (PRILOSEC) 20 MG capsule Take 20 mg by mouth daily.    Allergies: Patient has no known allergies.  Social History   Tobacco Use  . Smoking status: Never Smoker  . Smokeless tobacco: Never Used  Substance Use Topics  . Alcohol use: No    Alcohol/week: 0.0 standard drinks  . Drug use: No    Family History  Problem Relation Age of Onset  . Cancer - Lung Father   . Heart failure Father   . Heart attack Father 41  . Bipolar disorder Mother   . Hyperlipidemia Mother   . Hypertension Mother   . Breast cancer Maternal Grandmother     Review of Systems: Mr. Bonawitz notes intermittent numbness and tingling in both hands as well as his feet from.  Otherwise, 12-system review of systems was performed and was negative except as noted in the  HPI.  --------------------------------------------------------------------------------------------------  Physical Exam: BP (!) 120/100 (BP Location: Left Arm, Patient Position: Sitting, Cuff Size: Normal)   Pulse 77   Ht 6\' 2"  (1.88 m)   Wt 238 lb (108 kg)   SpO2 97%   BMI 30.56 kg/m   General: NAD.  Accompanied by his sister. HEENT: No conjunctival pallor or scleral icterus. Facemask in  place. Neck: Supple without lymphadenopathy, thyromegaly, JVD, or HJR. Lungs: Normal work of breathing. Clear to auscultation bilaterally without wheezes or crackles. Heart: Regular rate and rhythm without murmurs, rubs, or gallops. Non-displaced PMI. Abd: Bowel sounds present. Soft, NT/ND without hepatosplenomegaly Ext: No lower extremity edema. Radial, PT, and DP pulses are 2+ bilaterally. Skin: Warm and dry without rash.  Prominent tanning noted, even in areas none typically exposed to sunlight.  Lab Results  Component Value Date   WBC 5.3 10/09/2019   HGB 16.0 10/09/2019   HCT 47.6 10/09/2019   MCV 84.5 10/09/2019   PLT 215 10/09/2019    Lab Results  Component Value Date   NA 139 10/09/2019   K 3.9 10/09/2019   CL 103 10/09/2019   CO2 31 10/09/2019   BUN 16 10/09/2019   CREATININE 0.95 10/09/2019   GLUCOSE 99 10/09/2019   ALT 42 10/09/2019    Lab Results  Component Value Date   CHOL 155 10/21/2016   HDL 38 (L) 10/21/2016   [place] 95 10/21/2016   TRIG 111 10/21/2016   CHOLHDL 4.1 10/21/2016    --------------------------------------------------------------------------------------------------  ASSESSMENT AND PLAN: Nonobstructive coronary artery disease: CTA showed mild to moderate plaquing of the proximal and mid LAD without obstructive disease.  Continue risk factor modification and medical therapy to prevent progression of disease.  I do not believe that these findings explain Mr. Goodwyn's chronic fatigue and atypical chest pain.  We will check a fasting lipid panel with labs in 2 weeks in anticipation of adding a statin.  Fatigue: Longstanding and nonspecific.  Thus far, I have not encountered a cardiac explanation for this.  Sleep study is currently pending.  This may shed some light on Mr. Dohrmann's fatigue.  I recommended that we check iron studies, as his prominent tanning could be a manifestation of hemochromatosis.  Hypertension: Blood pressure suboptimally  controlled today.  I will add lisinopril 5 mg daily to Mr. Broadus's current regimen of hydrochlorothiazide and metoprolol succinate.  We will check a basic metabolic panel in 2 weeks.  Follow-up: Return to clinic in 6 weeks.  Nelva Bush, MD 12/23/2019 3:34 PM

## 2019-12-23 ENCOUNTER — Encounter: Payer: Self-pay | Admitting: Internal Medicine

## 2019-12-26 DIAGNOSIS — R079 Chest pain, unspecified: Secondary | ICD-10-CM | POA: Diagnosis not present

## 2019-12-26 DIAGNOSIS — R5383 Other fatigue: Secondary | ICD-10-CM

## 2019-12-26 DIAGNOSIS — Z6830 Body mass index (BMI) 30.0-30.9, adult: Secondary | ICD-10-CM

## 2020-01-08 ENCOUNTER — Other Ambulatory Visit: Payer: Self-pay

## 2020-01-08 ENCOUNTER — Other Ambulatory Visit (INDEPENDENT_AMBULATORY_CARE_PROVIDER_SITE_OTHER): Payer: 59 | Admitting: *Deleted

## 2020-01-08 DIAGNOSIS — I10 Essential (primary) hypertension: Secondary | ICD-10-CM

## 2020-01-08 DIAGNOSIS — Z79899 Other long term (current) drug therapy: Secondary | ICD-10-CM | POA: Diagnosis not present

## 2020-01-08 DIAGNOSIS — R5383 Other fatigue: Secondary | ICD-10-CM

## 2020-01-08 DIAGNOSIS — Z13220 Encounter for screening for lipoid disorders: Secondary | ICD-10-CM | POA: Diagnosis not present

## 2020-01-09 LAB — COMPREHENSIVE METABOLIC PANEL
ALT: 37 IU/L (ref 0–44)
AST: 24 IU/L (ref 0–40)
Albumin/Globulin Ratio: 1.6 (ref 1.2–2.2)
Albumin: 4.2 g/dL (ref 3.8–4.9)
Alkaline Phosphatase: 74 IU/L (ref 48–121)
BUN/Creatinine Ratio: 12 (ref 9–20)
BUN: 14 mg/dL (ref 6–24)
Bilirubin Total: 0.5 mg/dL (ref 0.0–1.2)
CO2: 22 mmol/L (ref 20–29)
Calcium: 9.8 mg/dL (ref 8.7–10.2)
Chloride: 102 mmol/L (ref 96–106)
Creatinine, Ser: 1.16 mg/dL (ref 0.76–1.27)
GFR calc Af Amer: 83 mL/min/{1.73_m2} (ref >59)
GFR calc non Af Amer: 72 mL/min/{1.73_m2} (ref >59)
Globulin, Total: 2.6 g/dL (ref 1.5–4.5)
Glucose: 87 mg/dL (ref 65–99)
Potassium: 4.2 mmol/L (ref 3.5–5.2)
Sodium: 139 mmol/L (ref 134–144)
Total Protein: 6.8 g/dL (ref 6.0–8.5)

## 2020-01-09 LAB — IRON,TIBC AND FERRITIN PANEL
Ferritin: 95 ng/mL (ref 30–400)
Iron Saturation: 42 % (ref 15–55)
Iron: 91 ug/dL (ref 38–169)
Total Iron Binding Capacity: 216 ug/dL — ABNORMAL LOW (ref 250–450)
UIBC: 125 ug/dL (ref 111–343)

## 2020-01-09 LAB — LIPID PANEL
Chol/HDL Ratio: 4.9 ratio (ref 0.0–5.0)
Cholesterol, Total: 158 mg/dL (ref 100–199)
HDL: 32 mg/dL — ABNORMAL LOW (ref >39)
LDL Chol Calc (NIH): 103 mg/dL — ABNORMAL HIGH (ref 0–99)
Triglycerides: 124 mg/dL (ref 0–149)
VLDL Cholesterol Cal: 23 mg/dL (ref 5–40)

## 2020-01-10 ENCOUNTER — Telehealth: Payer: Self-pay | Admitting: *Deleted

## 2020-01-10 ENCOUNTER — Telehealth: Payer: Self-pay | Admitting: Adult Health

## 2020-01-10 DIAGNOSIS — I10 Essential (primary) hypertension: Secondary | ICD-10-CM

## 2020-01-10 DIAGNOSIS — E785 Hyperlipidemia, unspecified: Secondary | ICD-10-CM

## 2020-01-10 DIAGNOSIS — Z79899 Other long term (current) drug therapy: Secondary | ICD-10-CM

## 2020-01-10 MED ORDER — ROSUVASTATIN CALCIUM 5 MG PO TABS: 5.0000 mg | ORAL_TABLET | Freq: Every day | ORAL | 1 refills | Status: DC

## 2020-01-10 NOTE — Telephone Encounter (Signed)
-----   Message from Yvonne Kendall, MD sent at 01/10/2020  7:18 AM EDT ----- Please let Mr. Gandolfo know that his iron studies do not show evidence of abnormal iron retention.  His kidney function, liver function, and electrolytes are normal.  His bad cholesterol (LDL) is slightly elevated.  Given mild plaquing on recent cardiac CTA, I recommend that we add rosuvastatin 5 mg daily and repeat a fasting lipid panel and ALT in ~3 months.  Mr. Sweetin should continue the remainder of his medications and follow-up as previously discussed.

## 2020-01-10 NOTE — Telephone Encounter (Signed)
Results called to pt. Pt verbalized understanding of results, to start rosuvastatin 5 mg daily and to get fasting labs in 3 months at the [facility]. Rx sent to pharmacy and lab orders entered.  Patient also complains of continued elevated BP at home. Verified his current meds and he is taking them as prescribed. Reports his BP in the afternoon is around 130's/ 90-95.  He has not recorded his heart rate. Denies headache or blurred vision.  Does report the same chest pain he's been having as reported at office visits.  Next appt is 02/07/20. Advised I will route to Dr End for further advice.

## 2020-01-11 NOTE — Telephone Encounter (Signed)
Please have Mr. Zimmerle increase lisinopril to 10 mg daily and f/u as previously discussed.  Yvonne Kendall, MD [facility]

## 2020-01-11 NOTE — Telephone Encounter (Signed)
Spoke with patient, advised him that he will be contacted to schedule his sleep study, but it may be 4-6 weeks.  He verbalized understanding.

## 2020-01-12 MED ORDER — LISINOPRIL 10 MG PO TABS: 10.0000 mg | ORAL_TABLET | Freq: Every day | ORAL | 1 refills | Status: DC

## 2020-01-12 NOTE — Telephone Encounter (Signed)
Patient verbalized understanding to increase lisinopril to 10 mg daily. Rx sent to pharmacy.

## 2020-01-26 NOTE — Progress Notes (Deleted)
Follow-up Outpatient Visit Date: 02/07/2020  Primary Care Provider: Steele Sizer, MD No address on file  Chief Complaint: ***  HPI:  Howard Smith is a 54 y.o. male with history of hypertension, who presents for follow-up of chest pain and hypertension.  I last saw him in late May, at which time he continued to complain of daily fatigue.  Chest pain had improved, and he was tolerating metoprolol and HCTZ well.  Due to suboptimal blood pressure control, we agreed to add lisinopril.  Sleep study is pending.  --------------------------------------------------------------------------------------------------  Cardiovascular History & Procedures: Cardiovascular Problems:  Atypical chest pain  Palpitation  Risk Factors:  Hypertension, obesity, and male gender  Cath/PCI:  None  CV Surgery:  None  EP Procedures and Devices:  Holter monitor (01/28/2017, [facility]): Sinus rhythm with average heart rate of 77 bpm. No supraventricular or ventricular ectopy.  Non-Invasive Evaluation(s):  Cardiac CTA (12/18/2019): Normal coronary origins with right dominance.  Mild to moderate atherosclerosis of the proximal and mid LAD (less than 50%).  CTFFR negative in LMCA, LAD, LCx, and RCA.  Coronary calcium score 20 (68th percentile for age and sex matched controls).  No significant extracardiac findings.  Transthoracic echocardiogram (10/09/2019): Normal LV size with LVEF 60-65% and normal diastolic function. Normal RV size and function. No significant valvular abnormality.  Exercise tolerance test (02/03/2017, [facility]): Good exercise capacity without angina or EKG changes consistent with low risk study.  Recent CV Pertinent Labs: Lab Results  Component Value Date   CHOL 158 01/08/2020   HDL 32 (L) 01/08/2020   LDLCALC 103 (H) 01/08/2020   TRIG 124 01/08/2020   CHOLHDL 4.9 01/08/2020   K 4.2 01/08/2020   BUN 14 01/08/2020   CREATININE 1.16 01/08/2020    Past  medical and surgical history were reviewed and updated in EPIC.  No outpatient medications have been marked as taking for the 02/07/20 encounter (Appointment) with Alvah Gilder, Cristal Deer, MD.    Allergies: Patient has no known allergies.  Social History   Tobacco Use  . Smoking status: Never Smoker  . Smokeless tobacco: Never Used  Vaping Use  . Vaping Use: Never used  Substance Use Topics  . Alcohol use: No    Alcohol/week: 0.0 standard drinks  . Drug use: No    Family History  Problem Relation Age of Onset  . Cancer - Lung Father   . Heart failure Father   . Heart attack Father 88  . Bipolar disorder Mother   . Hyperlipidemia Mother   . Hypertension Mother   . Breast cancer Maternal Grandmother     Review of Systems: A 12-system review of systems was performed and was negative except as noted in the HPI.  --------------------------------------------------------------------------------------------------  Physical Exam: There were no vitals taken for this visit.  General:  *** HEENT: No conjunctival pallor or scleral icterus. Facemask in place. Neck: Supple without lymphadenopathy, thyromegaly, JVD, or HJR. Lungs: Normal work of breathing. Clear to auscultation bilaterally without wheezes or crackles. Heart: Regular rate and rhythm without murmurs, rubs, or gallops. Non-displaced PMI. Abd: Bowel sounds present. Soft, NT/ND without hepatosplenomegaly Ext: No lower extremity edema. Radial, PT, and DP pulses are 2+ bilaterally. Skin: Warm and dry without rash.  EKG:  ***  Lab Results  Component Value Date   WBC 5.3 10/09/2019   HGB 16.0 10/09/2019   HCT 47.6 10/09/2019   MCV 84.5 10/09/2019   PLT 215 10/09/2019    Lab Results  Component Value Date  NA 139 01/08/2020   K 4.2 01/08/2020   CL 102 01/08/2020   CO2 22 01/08/2020   BUN 14 01/08/2020   CREATININE 1.16 01/08/2020   GLUCOSE 87 01/08/2020   ALT 37 01/08/2020    Lab Results  Component Value Date    CHOL 158 01/08/2020   HDL 32 (L) 01/08/2020   LDLCALC 103 (H) 01/08/2020   TRIG 124 01/08/2020   CHOLHDL 4.9 01/08/2020    --------------------------------------------------------------------------------------------------  ASSESSMENT AND PLAN: Cristal Deer Sharea Guinther, MD 01/26/2020 12:54 PM

## 2020-02-06 ENCOUNTER — Other Ambulatory Visit: Payer: Self-pay

## 2020-02-06 ENCOUNTER — Ambulatory Visit: Payer: 59

## 2020-02-06 DIAGNOSIS — R400 Somnolence: Secondary | ICD-10-CM

## 2020-02-07 ENCOUNTER — Ambulatory Visit: Payer: 59 | Admitting: Internal Medicine

## 2020-02-07 DIAGNOSIS — Z03818 Encounter for observation for suspected exposure to other biological agents ruled out: Secondary | ICD-10-CM | POA: Diagnosis not present

## 2020-02-07 DIAGNOSIS — R05 Cough: Secondary | ICD-10-CM | POA: Diagnosis not present

## 2020-02-07 DIAGNOSIS — Z20822 Contact with and (suspected) exposure to covid-19: Secondary | ICD-10-CM | POA: Diagnosis not present

## 2020-02-07 DIAGNOSIS — R509 Fever, unspecified: Secondary | ICD-10-CM | POA: Diagnosis not present

## 2020-03-16 ENCOUNTER — Other Ambulatory Visit: Payer: Self-pay | Admitting: Internal Medicine

## 2020-03-24 NOTE — Progress Notes (Signed)
Cardiology Office Note    Date:  03/25/2020   ID:  Howard Smith., DOB May 20, 1966, MRN [id number]  PCP:  Steele Sizer, MD  Cardiologist:  Yvonne Kendall, MD  Electrophysiologist:  None   Chief Complaint: Follow up  History of Present Illness:   Howard Jacinto. is a 54 y.o. male with history of nonobstructive CAD noted on noninvasive imaging, COVID-19 infection in 01/2020, HLD, and HTN who presents for follow up of fatigue.  He was previously evaluated by [facility] in 2018 for palpiations with Holter monitor in 01/2017 demonstrating sinus rhythm with an average heart rate of 77 bpm and no supraventricular or ventricular ectopy. He was seen by Dr. Okey Dupre in 09/2019 with continued fatigue and right-sided chest pain. At that time, he underwent echo which showed an EF of 60 to 65%, no regional wall motion abnormalities, normal LV diastolic function, normal RV systolic function and RV cavity size, no significant valvular abnormality. He also underwent coronary CTA in 11/2019 which showed mild to moderate nonobstructive CAD as outlined below. He was last seen in the office in 11/2019 and was feeling about the same, continuing to note fatigue on most days, though had not had much chest pain and was tolerating metoprolol and HCTZ. His chronic ankle edema was unchanged. He was awaiting insurance approval for sleep study. Labs were unrevealing, including iron studies as outlined below. Lisinopril was added due to suboptimally controlled hypertension.  He was subsequently diagnosed with COVID-19 in 01/2020 and did not require hospital admission.  He comes in continuing to note fatigue and is scheduled for his sleep study this evening.  Over the past 24 hours he has noted intermittent dizziness which feels like the room is spinning around him when he closes his eyes.  No associated chest pain, dyspnea, palpitations, presyncope, or syncope.  There has been some nausea without emesis with this.   BP is well controlled on current regimen.   Labs independently reviewed: 12/2019 - TC 158, TG 124, HDL 32, LDL 103, BUN 14, SCr 1.16, potassium 4.2, AST/ALT normal, albumin 4.2 09/2019 - HGB 16.0, PLT 215, TSH normal  Past Medical History:  Diagnosis Date  . Heartburn   . Hypertension     Past Surgical History:  Procedure Laterality Date  . none      Current Medications: Current Meds  Medication Sig  . aspirin EC 81 MG tablet Take 1 tablet (81 mg total) by mouth daily.  . hydrochlorothiazide (HYDRODIURIL) 25 MG tablet Take 25 mg by mouth daily.   Marland Kitchen lisinopril (ZESTRIL) 10 MG tablet Take 1 tablet (10 mg total) by mouth daily.  . metoprolol succinate (TOPROL-XL) 25 MG 24 hr tablet Take 50 mg by mouth daily.   Marland Kitchen omeprazole (PRILOSEC) 20 MG capsule Take 20 mg by mouth daily.  . rosuvastatin (CRESTOR) 5 MG tablet Take 1 tablet (5 mg total) by mouth daily.    Allergies:   Patient has no known allergies.   Social History   Socioeconomic History  . Marital status: Married    Spouse name: Not on file  . Number of children: Not on file  . Years of education: Not on file  . Highest education level: Not on file  Occupational History  . Not on file  Tobacco Use  . Smoking status: Never Smoker  . Smokeless tobacco: Never Used  Vaping Use  . Vaping Use: Never used  Substance and Sexual Activity  . Alcohol use: No  Alcohol/week: 0.0 standard drinks  . Drug use: No  . Sexual activity: Not on file  Other Topics Concern  . Not on file  Social History Narrative  . Not on file   Social Determinants of Health   Financial Resource Strain:   . Difficulty of Paying Living Expenses: Not on file  Food Insecurity:   . Worried About Programme researcher, broadcasting/film/video in the Last Year: Not on file  . Ran Out of Food in the Last Year: Not on file  Transportation Needs:   . Lack of Transportation (Medical): Not on file  . Lack of Transportation (Non-Medical): Not on file  Physical Activity:   .  Days of Exercise per Week: Not on file  . Minutes of Exercise per Session: Not on file  Stress:   . Feeling of Stress : Not on file  Social Connections:   . Frequency of Communication with Friends and Family: Not on file  . Frequency of Social Gatherings with Friends and Family: Not on file  . Attends Religious Services: Not on file  . Active Member of Clubs or Organizations: Not on file  . Attends Banker Meetings: Not on file  . Marital Status: Not on file     Family History:  The patient's family history includes Bipolar disorder in his mother; Breast cancer in his maternal grandmother; Cancer - Lung in his father; Heart attack (age of onset: 37) in his father; Heart failure in his father; Hyperlipidemia in his mother; Hypertension in his mother.  ROS:   Review of Systems  Constitutional: Positive for malaise/fatigue. Negative for chills, diaphoresis, fever and weight loss.  HENT: Negative for congestion.   Eyes: Negative for discharge and redness.  Respiratory: Negative for cough, sputum production, shortness of breath and wheezing.   Cardiovascular: Negative for chest pain, palpitations, orthopnea, claudication, leg swelling and PND.  Gastrointestinal: Positive for nausea. Negative for abdominal pain, heartburn and vomiting.  Musculoskeletal: Negative for falls and myalgias.  Skin: Negative for rash.  Neurological: Positive for dizziness and headaches. Negative for tingling, tremors, sensory change, speech change, focal weakness, loss of consciousness and weakness.  Endo/Heme/Allergies: Does not bruise/bleed easily.  Psychiatric/Behavioral: Negative for substance abuse. The patient is not nervous/anxious.   All other systems reviewed and are negative.    EKGs/Labs/Other Studies Reviewed:    Studies reviewed were summarized above. The additional studies were reviewed today:  2D echo 09/2019: 1. Left ventricular ejection fraction, by estimation, is 60 to 65%. The   left ventricle has normal function. The left ventricle has no regional  wall motion abnormalities. Left ventricular diastolic parameters were  normal.  2. Right ventricular systolic function is normal. The right ventricular  size is normal. There is normal pulmonary artery systolic pressure.  3. The mitral valve is normal in structure. No evidence of mitral valve  regurgitation. No evidence of mitral stenosis.  4. The aortic valve is normal in structure. Aortic valve regurgitation is  not visualized. No aortic stenosis is present.  5. The inferior vena cava is normal in size with greater than 50%  respiratory variability, suggesting right atrial pressure of 3 mmHg. __________  Coronary CTA 11/2019: 1. Coronary calcium score of 20. This was 68th percentile for age and sex matched control.  2.  Normal coronary origin with right dominance.  3.  Mild atherosclerosis of the LAD.  CAD-RADS 2.  4.  Consider non-athersclerotic causes of chest pain.  5.  Recommend preventive therapy and risk  factor modification.  6.  [facility]. __________  FFR 11/2019: 1. Left Main: No significant stenosis. LM FFR = 0.97.  2. LAD: No significant stenosis. Proximal FFR = 0.97, Mid FFR = 0.93, Distal FFR = 0.88.  3. LCX: No significant stenosis. Proximal FFR = 0.96, Mid FFR = 0.95, Distal FFR = 0.93.  4. RCA: No significant stenosis. Proximal FFR = 0.99, Mid FFR = 0.97, Distal FFR = 0.80, PDA FFR = 0.90.  IMPRESSION: 1. CT FFR flow analysis demonstrates no hemodynamically significant flow limiting lesions. __________  Orthostatic vital signs 03/25/2020:  Lying: 121/81, 65 bpm, dizzy Sitting: 120/84, 76 bpm, dizzy Standing: 114/83, 76 bpm, flushing Standing x3 minutes: 118/85, 76 bpm, flushing   EKG:  EKG is ordered today.  The EKG ordered today demonstrates NSR, 62 bpm, no acute ST-T change  Recent Labs: 10/09/2019: Hemoglobin 16.0;  Platelets 215; TSH 1.409 01/08/2020: ALT 37; BUN 14; Creatinine, Ser 1.16; Potassium 4.2; Sodium 139  Recent Lipid Panel    Component Value Date/Time   CHOL 158 01/08/2020 0737   TRIG 124 01/08/2020 0737   HDL 32 (L) 01/08/2020 0737   CHOLHDL 4.9 01/08/2020 0737   LDLCALC 103 (H) 01/08/2020 0737    PHYSICAL EXAM:    VS:  BP 120/82 (BP Location: Left Arm, Patient Position: Sitting, Cuff Size: Large)   Pulse 62   Ht 6\' 2"  (1.88 m)   Wt 231 lb (104.8 kg)   SpO2 97%   BMI 29.66 kg/m   BMI: Body mass index is 29.66 kg/m.  Physical Exam Constitutional:      Appearance: He is well-developed.  HENT:     Head: Normocephalic and atraumatic.  Eyes:     General:        Right eye: No discharge.        Left eye: No discharge.  Neck:     Vascular: No JVD.  Cardiovascular:     Rate and Rhythm: Normal rate and regular rhythm.     Pulses: No midsystolic click and no opening snap.          Posterior tibial pulses are 2+ on the right side and 2+ on the left side.     Heart sounds: Normal heart sounds, S1 normal and S2 normal. Heart sounds not distant. No murmur heard.  No friction rub.  Pulmonary:     Effort: Pulmonary effort is normal. No respiratory distress.     Breath sounds: Normal breath sounds. No decreased breath sounds, wheezing or rales.  Chest:     Chest wall: No tenderness.  Abdominal:     General: There is no distension.     Palpations: Abdomen is soft.     Tenderness: There is no abdominal tenderness.  Musculoskeletal:     Cervical back: Normal range of motion.  Skin:    General: Skin is warm and dry.     Nails: There is no clubbing.  Neurological:     Mental Status: He is alert and oriented to person, place, and time.  Psychiatric:        Speech: Speech normal.        Behavior: Behavior normal.        Thought Content: Thought content normal.        Judgment: Judgment normal.     Wt Readings from Last 3 Encounters:  03/25/20 231 lb (104.8 kg)  12/22/19 238  lb (108 kg)  12/19/19 239  lb 9.6 oz (108.7 kg)     ASSESSMENT & PLAN:   1. Nonobstructive CAD: Recent coronary CTA showed mild to moderate plaquing of the proximal and mid LAD without evidence of obstructive disease.  Continue risk factor modification and medical therapy to prevent progression of CAD, including aspirin, metoprolol, Crestor, and lisinopril.  2. Dizziness: Orthostatic vital signs negative in the office today.  Symptoms concerning for vertigo.  Trial of meclizine 12.5 mg twice daily as needed dizziness.  No symptoms of tachypalpitations associated with this.  3. HTN: Blood pressure continue HCTZ, lisinopril, and Toprol-XL.  4. Fatigue: This has been a longstanding issue without objective cardiac explanation for her symptoms by echo and coronary CTA.  He has been referred for sleep study which is scheduled for this evening.  This may shed some light in his overall constellation of symptoms.  5. HLD: LDL of 103 from 12/2019, prior to statin initiation with goal being less than 70.  Plan to follow-up fasting lipid panel and liver function next month.  Disposition: F/u with Dr. Okey Dupre or an APP in 2 months.   Medication Adjustments/Labs and Tests Ordered: Current medicines are reviewed at length with the patient today.  Concerns regarding medicines are outlined above. Medication changes, Labs and Tests ordered today are summarized above and listed in the Patient Instructions accessible in Encounters.   Signed, Eula Listen, PA-C 03/25/2020 9:13 AM     [facility] [place] [address] [place], [place] [address] [phone]

## 2020-03-25 ENCOUNTER — Ambulatory Visit: Payer: 59

## 2020-03-25 ENCOUNTER — Other Ambulatory Visit: Payer: Self-pay

## 2020-03-25 ENCOUNTER — Ambulatory Visit: Payer: 59 | Admitting: Physician Assistant

## 2020-03-25 ENCOUNTER — Encounter: Payer: Self-pay | Admitting: Physician Assistant

## 2020-03-25 VITALS — BP 120/82 | HR 62 | Ht 74.0 in | Wt 231.0 lb

## 2020-03-25 DIAGNOSIS — I10 Essential (primary) hypertension: Secondary | ICD-10-CM | POA: Diagnosis not present

## 2020-03-25 DIAGNOSIS — E785 Hyperlipidemia, unspecified: Secondary | ICD-10-CM

## 2020-03-25 DIAGNOSIS — R5383 Other fatigue: Secondary | ICD-10-CM

## 2020-03-25 DIAGNOSIS — R42 Dizziness and giddiness: Secondary | ICD-10-CM | POA: Diagnosis not present

## 2020-03-25 DIAGNOSIS — I2510 Atherosclerotic heart disease of native coronary artery without angina pectoris: Secondary | ICD-10-CM | POA: Diagnosis not present

## 2020-03-25 MED ORDER — MECLIZINE HCL 12.5 MG PO TABS: 12.5000 mg | ORAL_TABLET | Freq: Two times a day (BID) | ORAL | 0 refills | Status: DC | PRN

## 2020-03-25 NOTE — Patient Instructions (Signed)
Medication Instructions:  1- START Meclizine Take 1 tablet (12.5 mg total) by mouth 2 (two) times daily as needed for dizziness *If you need a refill on your cardiac medications before your next appointment, please call your pharmacy*   Lab Work: None ordered If you have labs (blood work) drawn today and your tests are completely normal, you will receive your results only by: Marland Kitchen MyChart Message (if you have MyChart) OR . A paper copy in the mail If you have any lab test that is abnormal or we need to change your treatment, we will call you to review the results.   Testing/Procedures: None ordered   Follow-Up: At [facility], you and your health needs are our priority.  As part of our continuing mission to provide you with exceptional heart care, we have created designated Provider Care Teams.  These Care Teams include your primary Cardiologist (physician) and Advanced Practice Providers (APPs -  Physician Assistants and Nurse Practitioners) who all work together to provide you with the care you need, when you need it.  We recommend signing up for the patient portal called "MyChart".  Sign up information is provided on this After Visit Summary.  MyChart is used to connect with patients for Virtual Visits (Telemedicine).  Patients are able to view lab/test results, encounter notes, upcoming appointments, etc.  Non-urgent messages can be sent to your provider as well.   To learn more about what you can do with MyChart, go to [email or web address].    Your next appointment:   2 month(s)  The format for your next appointment:   In Person  Provider:    You may see Yvonne Kendall, MD or Eula Listen, PA-C

## 2020-03-29 ENCOUNTER — Telehealth: Payer: Self-pay | Admitting: Adult Health

## 2020-03-29 NOTE — Telephone Encounter (Signed)
HST was able to be interpreted will need to be redone please arrange with [facility] and patient

## 2020-03-29 NOTE — Telephone Encounter (Signed)
-----   Message from Coralyn Helling, MD sent at 03/29/2020  1:55 PM EDT ----- Howard Smith,  Home sleep study done on 03/26/20.  Poor tracing and couldn't interpret.  Please ask [facility] to arrange for repeat study.  Thanks.  [name]

## 2020-04-02 NOTE — Telephone Encounter (Signed)
Correction : was not able to be interpreted . Please reschedule

## 2020-04-03 NOTE — Telephone Encounter (Signed)
I have spoken with Howard Smith and he has been rescheduled to pick up HST machine on 04/08/2020 @ 2:30pm

## 2020-04-03 NOTE — Telephone Encounter (Signed)
This is a [facility] patient I will forward to there box

## 2020-04-08 ENCOUNTER — Other Ambulatory Visit: Payer: Self-pay

## 2020-04-08 DIAGNOSIS — G4733 Obstructive sleep apnea (adult) (pediatric): Secondary | ICD-10-CM

## 2020-04-10 DIAGNOSIS — G4733 Obstructive sleep apnea (adult) (pediatric): Secondary | ICD-10-CM

## 2020-04-15 ENCOUNTER — Telehealth: Payer: Self-pay | Admitting: Adult Health

## 2020-04-15 NOTE — Telephone Encounter (Signed)
Please call patient and notify results are in for HST:  Please set up a telemedicine /virtual visit to review and discuss treatment opitons.   Home sleep study from 04/08/20 showed moderate obstructive sleep apnea with an AHI of 15.2 and SpO2 low of 82%.

## 2020-04-16 NOTE — Telephone Encounter (Signed)
Called patient, he will call on 04/17/2020 to set up a follow up appointment with Rubye Oaks NP. He is unable to schedule at this time.

## 2020-04-19 ENCOUNTER — Encounter: Payer: Self-pay | Admitting: Adult Health

## 2020-04-19 ENCOUNTER — Ambulatory Visit (INDEPENDENT_AMBULATORY_CARE_PROVIDER_SITE_OTHER): Payer: 59 | Admitting: Adult Health

## 2020-04-19 ENCOUNTER — Telehealth: Payer: Self-pay | Admitting: Internal Medicine

## 2020-04-19 ENCOUNTER — Other Ambulatory Visit: Payer: Self-pay

## 2020-04-19 DIAGNOSIS — G4733 Obstructive sleep apnea (adult) (pediatric): Secondary | ICD-10-CM | POA: Diagnosis not present

## 2020-04-19 NOTE — Progress Notes (Signed)
Reviewed and agree with assessment/plan.   Adelei Scobey, MD [facility] 04/19/2020, 1:32 PM Pager:  [phone]  

## 2020-04-19 NOTE — Progress Notes (Signed)
Virtual Visit via Telephone Note  I connected with Howard Smith. on 04/19/20 at 12:00 PM EDT by telephone and verified that I am speaking with the correct person using two identifiers.  Location: Patient: Hydrographic surveyor: Office    I discussed the limitations, risks, security and privacy concerns of performing an evaluation and management service by telephone and the availability of in person appointments. I also discussed with the patient that there may be a patient responsible charge related to this service. The patient expressed understanding and agreed to proceed.   History of Present Illness: 54 year old male never smoker seen for sleep consult Dec 19, 2019 for daytime sleepiness and restless sleep.  Patient was set up for home sleep study on April 08, 2020 that showed moderate obstructive sleep apnea with AHI of 15.2 and SPO2 low at 82%. We reviewed his test results went over treatment options including weight loss, oral appliance or CPAP.  Patient has significant symptoms and with moderate sleep apnea discussed CPAP treatment option.  Patient is in agreement to begin CPAP. Patient education was given.  Patient has daytime sleepiness   Observations/Objective: Home sleep study from 04/08/20 showed moderate obstructive sleep apnea with an AHI of 15.2 and SpO2 low of 82%.   Assessment and Plan: Moderate obstructive sleep apnea with nocturnal hypoxemia.  We will begin nocturnal CPAP.  Plan  Patient Instructions  Begin CPAP At bedtime  May try Dream Wear Nasal mask  Wear for all night , try to get in at least 4-6 hrs as needed.  Do not drive if sleepy  Follow up in 2-3 months at [facility] office and As needed          Follow Up Instructions:   Follow-up in 2 to 3 months and as needed   I discussed the assessment and treatment plan with the patient. The patient was provided an opportunity to ask questions and all were answered. The patient agreed with  the plan and demonstrated an understanding of the instructions.   The patient was advised to call back or seek an in-person evaluation if the symptoms worsen or if the condition fails to improve as anticipated.  I provided 22  minutes of non-face-to-face time during this encounter.   Rubye Oaks, NP

## 2020-04-19 NOTE — Telephone Encounter (Signed)
Pt c/o medication issue:  1. Name of Medication: rosuvastatin 5 mg daily  2. How are you currently taking this medication (dosage and times per day)? See above  3. Are you having a reaction (difficulty breathing--STAT)? no  4. What is your medication issue? Patient states now the cost for this medication is $313.00 due to change of insurance. [facility] told patient that there should be other options for patient that may only cost $5. Patient would be appreciative for other options  Patient is out of medications and will need something soon

## 2020-04-19 NOTE — Patient Instructions (Signed)
Begin CPAP At bedtime  May try Dream Wear Nasal mask  Wear for all night , try to get in at least 4-6 hrs as needed.  Do not drive if sleepy  Follow up in 2-3 months at [facility] office and As needed

## 2020-04-19 NOTE — Telephone Encounter (Signed)
No answer. Left message to call back.   Looks like patient can get at [facility] with GoodRx coupon.  90 day supply for 14.32 30 day supply for 9.46

## 2020-04-22 MED ORDER — ROSUVASTATIN CALCIUM 5 MG PO TABS: 5.0000 mg | ORAL_TABLET | Freq: Every day | ORAL | 2 refills | Status: AC

## 2020-04-22 NOTE — Telephone Encounter (Signed)
Spoke to patient about the [employer] coupon and he was agreeable.  Coupon texted to patient and he was appreciative. Rx sent to [facility]. MyChart message sent to patient.

## 2020-05-30 ENCOUNTER — Other Ambulatory Visit: Payer: Self-pay

## 2020-05-30 ENCOUNTER — Encounter: Payer: Self-pay | Admitting: Internal Medicine

## 2020-05-30 ENCOUNTER — Ambulatory Visit (INDEPENDENT_AMBULATORY_CARE_PROVIDER_SITE_OTHER): Payer: 59 | Admitting: Internal Medicine

## 2020-05-30 VITALS — BP 110/90 | HR 88 | Ht 74.0 in | Wt 236.2 lb

## 2020-05-30 DIAGNOSIS — G4733 Obstructive sleep apnea (adult) (pediatric): Secondary | ICD-10-CM | POA: Diagnosis not present

## 2020-05-30 DIAGNOSIS — E785 Hyperlipidemia, unspecified: Secondary | ICD-10-CM

## 2020-05-30 DIAGNOSIS — I2510 Atherosclerotic heart disease of native coronary artery without angina pectoris: Secondary | ICD-10-CM | POA: Diagnosis not present

## 2020-05-30 DIAGNOSIS — R5383 Other fatigue: Secondary | ICD-10-CM | POA: Diagnosis not present

## 2020-05-30 DIAGNOSIS — I10 Essential (primary) hypertension: Secondary | ICD-10-CM

## 2020-05-30 NOTE — Progress Notes (Signed)
Follow-up Outpatient Visit Date: 05/30/2020  Primary Care Provider: Steele Sizer, MD No address on file  Chief Complaint: Fatigue  HPI:  Howard Smith is a 54 y.o. male with history of hypertension and obstructive sleep apnea, who presents for follow-up of chest pain and fatigue.  He was last seen in our office in late August by Eula Listen, PA.  At that time, he continued to complain of fatigue.  He also noticed vertigo over the preceding 24 hours, for which meclizine was prescribed.  Subsequent sleep study was notable for moderate sleep apnea.  Howard Smith expects his CPAP machine to be delivered today.  He reports that his chronic fatigue is not significantly different than at prior visits.  He denies chest pain and shortness of breath.  He wonders if he may have some intermittent swelling in his hands but otherwise has not had any significant edema.  He remains dizzy every now and then.   --------------------------------------------------------------------------------------------------  Cardiovascular History & Procedures: Cardiovascular Problems:  Atypical chest pain  Palpitation  Risk Factors:  Hypertension, obesity, and male gender  Cath/PCI:  None  CV Surgery:  None  EP Procedures and Devices:  Holter monitor (01/28/2017, [facility]): Sinus rhythm with average heart rate of 77 bpm. No supraventricular or ventricular ectopy.  Non-Invasive Evaluation(s):  Cardiac CTA (12/18/2019): Normal coronary origins with right dominance.  Mild to moderate atherosclerosis of the proximal and mid LAD (less than 50%).  Coronary calcium score 20 (68th percentile for age and sex matched controls).  No significant cardiac findings.  Transthoracic echocardiogram (10/09/2019): Normal LV size with LVEF 60-65% and normal diastolic function. Normal RV size and function. No significant valvular abnormality.  Exercise tolerance test (02/03/2017, [facility]): Good exercise capacity  without angina or EKG changes consistent with low risk study.  Recent CV Pertinent Labs: Lab Results  Component Value Date   CHOL 158 01/08/2020   HDL 32 (L) 01/08/2020   LDLCALC 103 (H) 01/08/2020   TRIG 124 01/08/2020   CHOLHDL 4.9 01/08/2020   K 4.2 01/08/2020   BUN 14 01/08/2020   CREATININE 1.16 01/08/2020    Past medical and surgical history were reviewed and updated in EPIC.  Current Meds  Medication Sig  . anastrozole (ARIMIDEX) 1 MG tablet Take 1 mg by mouth once a week.  Marland Kitchen aspirin EC 81 MG tablet Take 1 tablet (81 mg total) by mouth daily.  . hydrochlorothiazide (HYDRODIURIL) 25 MG tablet Take 25 mg by mouth daily.   Marland Kitchen lisinopril (ZESTRIL) 10 MG tablet Take 1 tablet (10 mg total) by mouth daily.  . meclizine (ANTIVERT) 12.5 MG tablet Take 1 tablet (12.5 mg total) by mouth 2 (two) times daily as needed for dizziness.  . metoprolol succinate (TOPROL-XL) 25 MG 24 hr tablet Take 25 mg by mouth daily.   Marland Kitchen omeprazole (PRILOSEC) 20 MG capsule Take 20 mg by mouth daily.  . rosuvastatin (CRESTOR) 5 MG tablet Take 1 tablet (5 mg total) by mouth daily.    Allergies: Patient has no known allergies.  Social History   Tobacco Use  . Smoking status: Never Smoker  . Smokeless tobacco: Never Used  Vaping Use  . Vaping Use: Never used  Substance Use Topics  . Alcohol use: No    Alcohol/week: 0.0 standard drinks  . Drug use: No    Family History  Problem Relation Age of Onset  . Cancer - Lung Father   . Heart failure Father   . Heart attack  Father 82  . Bipolar disorder Mother   . Hyperlipidemia Mother   . Hypertension Mother   . Breast cancer Maternal Grandmother     Review of Systems: A 12-system review of systems was performed and was negative except as noted in the HPI.  --------------------------------------------------------------------------------------------------  Physical Exam: BP 110/90 (BP Location: Left Arm, Patient Position: Sitting, Cuff Size:  Normal)   Pulse 88   Ht 6\' 2"  (1.88 m)   Wt 236 lb 4 oz (107.2 kg)   SpO2 96%   BMI 30.33 kg/m   Repeat blood pressure: 120/82  General: NAD.  Accompanied by his wife. Neck: No JVD. Lungs: Clear to auscultation. Heart: Regular rate and rhythm without murmurs. Abdomen: Soft, nontender, nondistended. Extremities: No lower extremity edema.  Lab Results  Component Value Date   WBC 5.3 10/09/2019   HGB 16.0 10/09/2019   HCT 47.6 10/09/2019   MCV 84.5 10/09/2019   PLT 215 10/09/2019    Lab Results  Component Value Date   NA 139 01/08/2020   K 4.2 01/08/2020   CL 102 01/08/2020   CO2 22 01/08/2020   BUN 14 01/08/2020   CREATININE 1.16 01/08/2020   GLUCOSE 87 01/08/2020   ALT 37 01/08/2020    Lab Results  Component Value Date   CHOL 158 01/08/2020   HDL 32 (L) 01/08/2020   LDLCALC 103 (H) 01/08/2020   TRIG 124 01/08/2020   CHOLHDL 4.9 01/08/2020    --------------------------------------------------------------------------------------------------  ASSESSMENT AND PLAN: Chronic fatigue and obstructive sleep apnea: Fatigue has been a longstanding issue.  I do not believe that it is driven primarily by underlying cardiac disease.  Echo was normal without systolic or diastolic dysfunction.  Cardiac CTA showed nonobstructive CAD.  I am hopeful that Howard Smith will feel better after having used CPAP for a few weeks.  I advised him that if his fatigue does not improve at all with CPAP use, that he should consider holding metoprolol in January to see if this improves his symptoms.  Hypertension: Initial blood pressure check notable for borderline elevated diastolic pressure.  Blood pressure normal on recheck.  We will defer medication changes today, though I have encouraged Howard Smith to hold metoprolol if his fatigue does not improve at all over the next 6 to 8 weeks.  Coronary artery disease and hyperlipidemia: Continue statin therapy and aspirin.  Target LDL would be less than  70, though in the setting of ongoing fatigue, I will defer escalation of rosuvastatin today.  Repeat lipid panel should be considered at her follow-up visit.  Follow-up: Return to clinic in 3 months.  February, MD 05/30/2020 8:25 AM

## 2020-05-30 NOTE — Patient Instructions (Signed)
Other Instructions Since you are starting the CPAP machine, if you do not see improvement of fatigue by early January, you may stop metoprolol at that time.  Medication Instructions:  Your physician recommends that you continue on your current medications as directed. Please refer to the Current Medication list given to you today.  *If you need a refill on your cardiac medications before your next appointment, please call your pharmacy*  Follow-Up: At [facility], you and your health needs are our priority.  As part of our continuing mission to provide you with exceptional heart care, we have created designated Provider Care Teams.  These Care Teams include your primary Cardiologist (physician) and Advanced Practice Providers (APPs -  Physician Assistants and Nurse Practitioners) who all work together to provide you with the care you need, when you need it.  We recommend signing up for the patient portal called "MyChart".  Sign up information is provided on this After Visit Summary.  MyChart is used to connect with patients for Virtual Visits (Telemedicine).  Patients are able to view lab/test results, encounter notes, upcoming appointments, etc.  Non-urgent messages can be sent to your provider as well.   To learn more about what you can do with MyChart, go to [email or web address].    Your next appointment:   3 month(s)  The format for your next appointment:   In Person  Provider:   You may see Yvonne Kendall, MD or one of the following Advanced Practice Providers on your designated Care Team:    Nicolasa Ducking, NP  Eula Listen, PA-C  Marisue Ivan, PA-C  Cadence [place], [place]

## 2020-06-03 ENCOUNTER — Ambulatory Visit
Admission: EM | Admit: 2020-06-03 | Discharge: 2020-06-03 | Disposition: A | Discharge status: Home / Self Care | Payer: 59 | Attending: Family Medicine | Admitting: Family Medicine

## 2020-06-03 ENCOUNTER — Encounter: Payer: Self-pay | Admitting: *Deleted

## 2020-06-03 DIAGNOSIS — R519 Headache, unspecified: Secondary | ICD-10-CM

## 2020-06-03 DIAGNOSIS — R0981 Nasal congestion: Secondary | ICD-10-CM | POA: Diagnosis not present

## 2020-06-03 DIAGNOSIS — J0140 Acute pansinusitis, unspecified: Secondary | ICD-10-CM

## 2020-06-03 DIAGNOSIS — J3489 Other specified disorders of nose and nasal sinuses: Secondary | ICD-10-CM | POA: Diagnosis not present

## 2020-06-03 MED ORDER — AMOXICILLIN-POT CLAVULANATE 875-125 MG PO TABS: 1.0000 | ORAL_TABLET | Freq: Two times a day (BID) | ORAL | 0 refills | Status: AC

## 2020-06-03 NOTE — ED Provider Notes (Signed)
[facility]   [id number] 06/03/20 Arrival Time: 1024  [id number] THROAT  SUBJECTIVE: History from: patient.  Howard Smith. is a 54 y.o. male who presents with abrupt onset of nasal congestion, sinus pain, headache, fatigue for the last 2 weeks.  Denies sick exposure to Covid, strep, flu or mono, or precipitating event. Has negative history of Covid. Has not had Covid vaccines. Has attempted OTC cough and cold with no relief. There are no aggravating symptoms. Denies previous symptoms in the past.     Denies fever, chills, ear pain, rhinorrhea, cough, SOB, wheezing, chest pain, nausea, rash, changes in bowel or bladder habits.     ROS: As per HPI.  All other pertinent ROS negative.     Past Medical History:  Diagnosis Date  . Heartburn   . Hypertension   . Obstructive sleep apnea    Past Surgical History:  Procedure Laterality Date  . none     No Known Allergies No current facility-administered medications on file prior to encounter.   Current Outpatient Medications on File Prior to Encounter  Medication Sig Dispense Refill  . anastrozole (ARIMIDEX) 1 MG tablet Take 1 mg by mouth once a week.    Marland Kitchen aspirin EC 81 MG tablet Take 1 tablet (81 mg total) by mouth daily. 90 tablet 3  . hydrochlorothiazide (HYDRODIURIL) 25 MG tablet Take 25 mg by mouth daily.     Marland Kitchen lisinopril (ZESTRIL) 10 MG tablet Take 1 tablet (10 mg total) by mouth daily. 90 tablet 1  . meclizine (ANTIVERT) 12.5 MG tablet Take 1 tablet (12.5 mg total) by mouth 2 (two) times daily as needed for dizziness. 30 tablet 0  . metoprolol succinate (TOPROL-XL) 25 MG 24 hr tablet Take 25 mg by mouth daily.     Marland Kitchen omeprazole (PRILOSEC) 20 MG capsule Take 20 mg by mouth daily.    . rosuvastatin (CRESTOR) 5 MG tablet Take 1 tablet (5 mg total) by mouth daily. 90 tablet 2   Social History   Socioeconomic History  . Marital status: Married    Spouse name: Not on file  . Number of children: Not on file  . Years  of education: Not on file  . Highest education level: Not on file  Occupational History  . Not on file  Tobacco Use  . Smoking status: Never Smoker  . Smokeless tobacco: Never Used  Vaping Use  . Vaping Use: Never used  Substance and Sexual Activity  . Alcohol use: No    Alcohol/week: 0.0 standard drinks  . Drug use: No  . Sexual activity: Not on file  Other Topics Concern  . Not on file  Social History Narrative  . Not on file   Social Determinants of Health   Financial Resource Strain:   . Difficulty of Paying Living Expenses: Not on file  Food Insecurity:   . Worried About Programme researcher, broadcasting/film/video in the Last Year: Not on file  . Ran Out of Food in the Last Year: Not on file  Transportation Needs:   . Lack of Transportation (Medical): Not on file  . Lack of Transportation (Non-Medical): Not on file  Physical Activity:   . Days of Exercise per Week: Not on file  . Minutes of Exercise per Session: Not on file  Stress:   . Feeling of Stress : Not on file  Social Connections:   . Frequency of Communication with Friends and Family: Not on file  . Frequency of Social  Gatherings with Friends and Family: Not on file  . Attends Religious Services: Not on file  . Active Member of Clubs or Organizations: Not on file  . Attends Banker Meetings: Not on file  . Marital Status: Not on file  Intimate Partner Violence:   . Fear of Current or Ex-Partner: Not on file  . Emotionally Abused: Not on file  . Physically Abused: Not on file  . Sexually Abused: Not on file   Family History  Problem Relation Age of Onset  . Cancer - Lung Father   . Heart failure Father   . Heart attack Father 46  . Bipolar disorder Mother   . Hyperlipidemia Mother   . Hypertension Mother   . Breast cancer Maternal Grandmother     OBJECTIVE:  Vitals:   06/03/20 1039  BP: 117/82  Pulse: 82  Resp: 17  Temp: 98.5 F (36.9 C)  TempSrc: Oral  SpO2: 93%     General appearance: alert;  appears fatigued, but nontoxic, speaking in full sentences and managing own secretions HEENT: NCAT; Ears: EACs clear, TMs pearly gray with visible cone of light, without erythema; Eyes: PERRL, EOMI grossly; Nose: no obvious rhinorrhea; Throat: oropharynx mildly erythematous, cobblestoning present, tonsils 1+ and mildly erythematous without white tonsillar exudates, uvula midline; Sinuses: frontal and maxillary sinuses tender to palpation Neck: supple with LAD Lungs: CTA bilaterally without adventitious breath sounds; cough absent Heart: regular rate and rhythm.  Radial pulses 2+ symmetrical bilaterally Skin: warm and dry Psychological: alert and cooperative; normal mood and affect  LABS: No results found for this or any previous visit (from the past 24 hour(s)).   ASSESSMENT & PLAN:  1. Acute non-recurrent pansinusitis   2. Sinus pain   3. Nasal congestion   4. Nonintractable headache, unspecified chronicity pattern, unspecified headache type     Meds ordered this encounter  Medications  . amoxicillin-clavulanate (AUGMENTIN) 875-125 MG tablet    Sig: Take 1 tablet by mouth 2 (two) times daily for 10 days.    Dispense:  20 tablet    Refill:  0    Order Specific Question:   Supervising Provider    Answer:   Merrilee Jansky [id number]    Acute Sinusitis Push fluids and get rest Prescribed amoxicillin 875mg  twice daily for 7 days.   Take as directed and to completion.  Drink warm or cool liquids, use throat lozenges, or popsicles to help alleviate symptoms Take OTC ibuprofen or tylenol as needed for pain May use Zyrtec D and flonase to help alleviate symptoms Follow up with PCP if symptoms persist Return or go to ER if you have any new or worsening symptoms such as fever, chills, nausea, vomiting, worsening sore throat, cough, abdominal pain, chest pain, changes in bowel or bladder habits.   Reviewed expectations re: course of current medical issues. Questions answered. Outlined  signs and symptoms indicating need for more acute intervention. Patient verbalized understanding. After Visit Summary given.          , NP 06/03/20 1101

## 2020-06-03 NOTE — ED Triage Notes (Signed)
Patient reports sinus pressure, nasal congestion/drainage and facial pain x 2 weeks. Mild relief with OTC meds. Reports he is started to have productive cough--green mucus.

## 2020-06-03 NOTE — Discharge Instructions (Signed)
I have sent in Augmentin for you to take twice a day for 7 days.  Follow up with this office or with primary care if symptoms are persisting.  Follow up in the ER for high fever, trouble swallowing, trouble breathing, other concerning symptoms.  

## 2020-07-05 ENCOUNTER — Other Ambulatory Visit: Payer: Self-pay | Admitting: Internal Medicine

## 2020-08-06 DIAGNOSIS — G4733 Obstructive sleep apnea (adult) (pediatric): Secondary | ICD-10-CM

## 2020-08-06 NOTE — Telephone Encounter (Signed)
Please advise on patient mychart message  Hello I have tried 3 different mask to my breathing machine and still feel like I am choking after using it a short time.  I am wondering if it is not a mask issue but maybe the air flow is too strong or too weak.  They tell me that the doctor is the only one who can make that kind of change.  Not sure if that is the issue but cannot wear the mask so far.

## 2020-08-06 NOTE — Telephone Encounter (Signed)
It is hard to tell about pressure bc Airview  Shows no usage.   Can cut down on pressure to 5 to 10cm and CPAP download in 4 weeks   Needs ov to discuss in 4 weeks how is doing

## 2020-08-07 NOTE — Telephone Encounter (Signed)
Order sent to [facility] for pressure changes.  Waiting on patient response to scheduling appointment.

## 2020-08-30 ENCOUNTER — Ambulatory Visit: Payer: 59 | Admitting: Internal Medicine

## 2020-09-09 ENCOUNTER — Encounter: Payer: Self-pay | Admitting: Adult Health

## 2020-09-09 ENCOUNTER — Ambulatory Visit (INDEPENDENT_AMBULATORY_CARE_PROVIDER_SITE_OTHER): Payer: 59 | Admitting: Adult Health

## 2020-09-09 ENCOUNTER — Other Ambulatory Visit: Payer: Self-pay

## 2020-09-09 DIAGNOSIS — G4733 Obstructive sleep apnea (adult) (pediatric): Secondary | ICD-10-CM

## 2020-09-09 DIAGNOSIS — E669 Obesity, unspecified: Secondary | ICD-10-CM | POA: Diagnosis not present

## 2020-09-09 NOTE — Progress Notes (Signed)
@Patient  ID: ., male    DOB: [birth year], 55 y.o.   MRN: [id number]  Chief Complaint  Patient presents with  . Follow-up  . Sleep Apnea    Referring provider: [id number], MD  HPI: 55 year old male never smoker seen for sleep consult for Dec 19, 2019 for daytime sleepiness and restless sleep found to have moderate obstructive sleep apnea   TEST/EVENTS :   moderate obstructive sleep apnea with AHI of 15.2 and SPO2 low at 82%.  09/09/2020 Follow up : OSA  Patient presents for a follow-up visit.  Patient was seen in May 2021 found to have obstructive sleep apnea.  Patient was started on CPAP.  Patient returns today saying he has been having trouble with his CPAP.  He is having trouble tolerating the mask.  He has recently ordered a nasal mask to see if this will be more comfortable.  CPAP download shows very poor compliance with only 13% usage.  Average usage at 49 minutes.  Patient is on auto CPAP 5 to 10 cm H2O.  AHI is 1.0. Says was unable to nasal pillows, dream wear nasal and full face, did not tolerate .  Awaiting a  the Full nasal mask . Says having hard time adjusting to use. We discussed some helpful tips for CPAP .  We had samples of Nasal wisp and dreamwear full face , he going to try these to see if this will help.   No Known Allergies  Immunization History  Administered Date(s) Administered  . Influenza-Unspecified 03/28/2015, 10/30/2019    Past Medical History:  Diagnosis Date  . Heartburn   . Hypertension   . Obstructive sleep apnea     Tobacco History: Social History   Tobacco Use  Smoking Status Never Smoker  Smokeless Tobacco Never Used   Counseling given: Not Answered   Outpatient Medications Prior to Visit  Medication Sig Dispense Refill  . aspirin EC 81 MG tablet Take 1 tablet (81 mg total) by mouth daily. 90 tablet 3  . diltiazem (CARDIZEM CD) 240 MG 24 hr capsule Take 240 mg by mouth daily.    12/30/2019 lisinopril (ZESTRIL) 10 MG  tablet TAKE 1 TABLET BY MOUTH EVERY DAY 90 tablet 0  . omeprazole (PRILOSEC) 20 MG capsule Take 20 mg by mouth daily.    . meclizine (ANTIVERT) 12.5 MG tablet Take 1 tablet (12.5 mg total) by mouth 2 (two) times daily as needed for dizziness. (Patient not taking: Reported on 09/09/2020) 30 tablet 0  . rosuvastatin (CRESTOR) 5 MG tablet Take 1 tablet (5 mg total) by mouth daily. 90 tablet 2  . anastrozole (ARIMIDEX) 1 MG tablet Take 1 mg by mouth once a week. (Patient not taking: Reported on 09/09/2020)    . hydrochlorothiazide (HYDRODIURIL) 25 MG tablet Take 25 mg by mouth daily.  (Patient not taking: Reported on 09/09/2020)    . metoprolol succinate (TOPROL-XL) 25 MG 24 hr tablet Take 25 mg by mouth daily.  (Patient not taking: Reported on 09/09/2020)     No facility-administered medications prior to visit.     Review of Systems:   Constitutional:   No  weight loss, night sweats,  Fevers, chills, fatigue, or  lassitude.  HEENT:   No headaches,  Difficulty swallowing,  Tooth/dental problems, or  Sore throat,                No sneezing, itching, ear ache, nasal congestion, post nasal drip,   CV:  No chest pain,  Orthopnea, PND, swelling in lower extremities, anasarca, dizziness, palpitations, syncope.   GI  No heartburn, indigestion, abdominal pain, nausea, vomiting, diarrhea, change in bowel habits, loss of appetite, bloody stools.   Resp: No shortness of breath with exertion or at rest.  No excess mucus, no productive cough,  No non-productive cough,  No coughing up of blood.  No change in color of mucus.  No wheezing.  No chest wall deformity  Skin: no rash or lesions.  GU: no dysuria, change in color of urine, no urgency or frequency.  No flank pain, no hematuria   MS:  No joint pain or swelling.  No decreased range of motion.  No back pain.    Physical Exam  BP 116/84 (BP Location: Left Arm, Patient Position: Sitting, Cuff Size: Normal)   Pulse 83   Temp 97.6 F (36.4 C)  (Temporal)   Ht 6\' 2"  (1.88 m)   Wt 235 lb 6.4 oz (106.8 kg)   SpO2 97%   BMI 30.22 kg/m   GEN: A/Ox3; pleasant , NAD, well nourished    HEENT:  [place]/AT,   NOSE-clear, THROAT-clear, no lesions, no postnasal drip or exudate noted.   NECK:  Supple w/ fair ROM; no JVD; normal carotid impulses w/o bruits; no thyromegaly or nodules palpated; no lymphadenopathy.    RESP  Clear  P & A; w/o, wheezes/ rales/ or rhonchi. no accessory muscle use, no dullness to percussion  CARD:  RRR, no m/r/g, no peripheral edema, pulses intact, no cyanosis or clubbing.  GI:   Soft & nt; nml bowel sounds; no organomegaly or masses detected.   Musco: Warm bil, no deformities or joint swelling noted.   Neuro: alert, no focal deficits noted.    Skin: Warm, no lesions or rashes    Lab Results:   BNP No results found for: BNP  ProBNP No results found for: PROBNP  Imaging: No results found.    No flowsheet data found.  No results found for: NITRICOXIDE      Assessment & Plan:   Obstructive sleep apnea Moderate obstructive sleep apnea-having difficulty tolerating CPAP.  Patient has tried several different mask.  We went over helpful tips to help with CPAP usage.  Asked him to try to wear his mask for about 30 minutes while reading or watching TV to try to help with desensitization.  Also gave him 2 samples of a DreamWear full face and a nasal wisp mask. Patient education on sleep apnea and potential complications of untreated sleep apnea  Plan  Patient Instructions  Try to wear your CPAP every single night.   May try Dream Wear Nasal mask  Wear all night , try to get in at least 4-6 hrs as needed.  Do not drive if sleepy  Follow up in 4 months at [facility] office and As needed          Obesity (BMI 30.0-34.9) Healthy weight loss.     11-28-1992, NP 09/09/2020

## 2020-09-09 NOTE — Assessment & Plan Note (Signed)
Healthy weight loss 

## 2020-09-09 NOTE — Assessment & Plan Note (Signed)
Moderate obstructive sleep apnea-having difficulty tolerating CPAP.  Patient has tried several different mask.  We went over helpful tips to help with CPAP usage.  Asked him to try to wear his mask for about 30 minutes while reading or watching TV to try to help with desensitization.  Also gave him 2 samples of a DreamWear full face and a nasal wisp mask. Patient education on sleep apnea and potential complications of untreated sleep apnea  Plan  Patient Instructions  Try to wear your CPAP every single night.   May try Dream Wear Nasal mask  Wear all night , try to get in at least 4-6 hrs as needed.  Do not drive if sleepy  Follow up in 4 months at [facility] office and As needed

## 2020-09-09 NOTE — Patient Instructions (Signed)
Try to wear your CPAP every single night.   May try Dream Wear Nasal mask  Wear all night , try to get in at least 4-6 hrs as needed.  Do not drive if sleepy  Follow up in 4 months at [facility] office and As needed

## 2021-01-22 ENCOUNTER — Other Ambulatory Visit: Payer: Self-pay | Admitting: Internal Medicine

## 2021-01-22 ENCOUNTER — Ambulatory Visit: Payer: 59 | Admitting: Internal Medicine

## 2021-01-22 NOTE — Telephone Encounter (Signed)
Please schedule overdue F/U appointment for refills. Thank you! 
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
## 2021-12-29 ENCOUNTER — Emergency Department: Payer: Managed Care, Other (non HMO)

## 2021-12-29 ENCOUNTER — Other Ambulatory Visit: Payer: Self-pay

## 2021-12-29 ENCOUNTER — Emergency Department
Admission: EM | Admit: 2021-12-29 | Discharge: 2021-12-29 | Disposition: A | Discharge status: Home / Self Care | Payer: Managed Care, Other (non HMO) | Attending: Emergency Medicine | Admitting: Emergency Medicine

## 2021-12-29 DIAGNOSIS — J020 Streptococcal pharyngitis: Secondary | ICD-10-CM | POA: Diagnosis not present

## 2021-12-29 DIAGNOSIS — R000 Tachycardia, unspecified: Secondary | ICD-10-CM | POA: Diagnosis not present

## 2021-12-29 DIAGNOSIS — D72829 Elevated white blood cell count, unspecified: Secondary | ICD-10-CM | POA: Insufficient documentation

## 2021-12-29 DIAGNOSIS — Z20822 Contact with and (suspected) exposure to covid-19: Secondary | ICD-10-CM | POA: Insufficient documentation

## 2021-12-29 DIAGNOSIS — R509 Fever, unspecified: Secondary | ICD-10-CM | POA: Diagnosis present

## 2021-12-29 DIAGNOSIS — I10 Essential (primary) hypertension: Secondary | ICD-10-CM | POA: Insufficient documentation

## 2021-12-29 DIAGNOSIS — R5383 Other fatigue: Secondary | ICD-10-CM | POA: Diagnosis not present

## 2021-12-29 LAB — COMPREHENSIVE METABOLIC PANEL
ALT: 23 U/L (ref 0–44)
AST: 27 U/L (ref 15–41)
Albumin: 3.9 g/dL (ref 3.5–5.0)
Alkaline Phosphatase: 93 U/L (ref 38–126)
Anion gap: 5 (ref 5–15)
BUN: 17 mg/dL (ref 6–20)
CO2: 24 mmol/L (ref 22–32)
Calcium: 9.5 mg/dL (ref 8.9–10.3)
Chloride: 106 mmol/L (ref 98–111)
Creatinine, Ser: 1.16 mg/dL (ref 0.61–1.24)
GFR, Estimated: >60 mL/min (ref >60)
Glucose, Bld: 103 mg/dL — ABNORMAL HIGH (ref 70–99)
Potassium: 3.9 mmol/L (ref 3.5–5.1)
Sodium: 135 mmol/L (ref 135–145)
Total Bilirubin: 0.5 mg/dL (ref 0.3–1.2)
Total Protein: 7.9 g/dL (ref 6.5–8.1)

## 2021-12-29 LAB — CBC
HCT: 49.4 % (ref 39.0–52.0)
Hemoglobin: 16.1 g/dL (ref 13.0–17.0)
MCH: 27.2 pg (ref 26.0–34.0)
MCHC: 32.6 g/dL (ref 30.0–36.0)
MCV: 83.4 fL (ref 80.0–100.0)
Platelets: 221 10*3/uL (ref 150–400)
RBC: 5.92 MIL/uL — ABNORMAL HIGH (ref 4.22–5.81)
RDW: 13.9 % (ref 11.5–15.5)
WBC: 14.4 10*3/uL — ABNORMAL HIGH (ref 4.0–10.5)
nRBC: 0 % (ref 0.0–0.2)

## 2021-12-29 LAB — TROPONIN I (HIGH SENSITIVITY): Troponin I (High Sensitivity): 7 ng/L (ref <18)

## 2021-12-29 LAB — SARS CORONAVIRUS 2 BY RT PCR: SARS Coronavirus 2 by RT PCR: NEGATIVE

## 2021-12-29 LAB — GROUP A STREP BY PCR: Group A Strep by PCR: DETECTED — AB

## 2021-12-29 MED ORDER — ACETAMINOPHEN 325 MG PO TABS
ORAL_TABLET | ORAL | Status: AC
  Filled 2021-12-29: qty 2

## 2021-12-29 MED ORDER — AMOXICILLIN 500 MG PO CAPS: 500.0000 mg | ORAL_CAPSULE | Freq: Two times a day (BID) | ORAL | 0 refills | Status: DC

## 2021-12-29 MED ORDER — ACETAMINOPHEN 325 MG PO TABS
650.0000 mg | ORAL_TABLET | Freq: Once | ORAL | Status: AC
Start: 2021-12-29 — End: 2021-12-29
  Administered 2021-12-29: 650 mg via ORAL

## 2021-12-29 NOTE — ED Provider Notes (Signed)
[facility] Provider Note    Event Date/Time   First MD Initiated Contact with Patient 12/29/21 1015     (approximate)   History   Fever and Chest Pain   HPI  Howard Kiang. is a 56 y.o. male   with a history of hypertension presents with complaints of fever, fatigue, sore throat over the last 2 days.  No sick contacts reported.  Denies chest pain to me.  No shortness of breath.     Physical Exam   Triage Vital Signs: ED Triage Vitals [12/29/21 0912]  Enc Vitals Group     BP (!) 133/93     Pulse Rate (!) 121     Resp 20     Temp (!) 102.4 F (39.1 C)     Temp Source Oral     SpO2 95 %     Weight 98 kg (216 lb)     Height 1.88 m (6\' 2" )     Head Circumference      Peak Flow      Pain Score 9     Pain Loc      Pain Edu?      Excl. in GC?     Most recent vital signs: Vitals:   12/29/21 0912  BP: (!) 133/93  Pulse: (!) 121  Resp: 20  Temp: (!) 102.4 F (39.1 C)  SpO2: 95%     General: Awake, no distress.  CV:  Good peripheral perfusion.  Mild tachycardia Resp:  Normal effort.  Abd:  No distention.  Other:     ED Results / Procedures / Treatments   Labs (all labs ordered are listed, but only abnormal results are displayed) Labs Reviewed  GROUP A STREP BY PCR - Abnormal; Notable for the following components:      Result Value   Group A Strep by PCR DETECTED (*)    All other components within normal limits  CBC - Abnormal; Notable for the following components:   WBC 14.4 (*)    RBC 5.92 (*)    All other components within normal limits  COMPREHENSIVE METABOLIC PANEL - Abnormal; Notable for the following components:   Glucose, Bld 103 (*)    All other components within normal limits  SARS CORONAVIRUS 2 BY RT PCR  TROPONIN I (HIGH SENSITIVITY)  TROPONIN I (HIGH SENSITIVITY)     EKG ED ECG REPORT I, 02/28/22, the attending physician, personally viewed and interpreted this ECG.  Date: 12/29/2021  Rhythm: n  sinus tachycardia QRS Axis: normal Intervals: normal ST/T Wave abnormalities: normal Narrative Interpretation: no evidence of acute ischemia    RADIOLOGY Chest x-ray viewed interpret by me, no acute abnormality, no pneumonia    PROCEDURES:  Critical Care performed:   Procedures   MEDICATIONS ORDERED IN ED: Medications  acetaminophen (TYLENOL) 325 MG tablet (has no administration in time range)  acetaminophen (TYLENOL) tablet 650 mg (650 mg Oral Given 12/29/21 0917)     IMPRESSION / MDM / ASSESSMENT AND PLAN / ED COURSE  I reviewed the triage vital signs and the nursing notes. Patient's presentation is most consistent with acute illness / injury with system symptoms.  Patient presents with tachycardia, fever, sore throat, fatigue, weakness  Differential includes viral illness such as COVID-19, strep throat which is common in the community at this time, pneumonia  Strep swab sent, labs pending  Lab work reviewed, mild elevation of white blood cell count, CMP is unremarkable, high sensitive  troponin and chest x-ray are reassuring, not consistent with pneumonia.  Strep swab is positive, will treat the patient with amoxicillin no indication for admission at this time, appropriate for discharge         FINAL CLINICAL IMPRESSION(S) / ED DIAGNOSES   Final diagnoses:  Strep throat     Rx / DC Orders   ED Discharge Orders          Ordered    amoxicillin (AMOXIL) 500 MG capsule  2 times daily        12/29/21 1015             Note:  This document was prepared using Dragon voice recognition software and may include unintentional dictation errors.   Jene Every, MD 12/29/21 1340

## 2021-12-29 NOTE — ED Triage Notes (Signed)
Pt in with co sore throat, fever, body aches, and chest pain that started yesterday.

## 2022-03-05 DIAGNOSIS — R972 Elevated prostate specific antigen [PSA]: Secondary | ICD-10-CM | POA: Diagnosis not present

## 2022-03-05 DIAGNOSIS — E291 Testicular hypofunction: Secondary | ICD-10-CM | POA: Diagnosis not present

## 2022-03-05 DIAGNOSIS — R5383 Other fatigue: Secondary | ICD-10-CM | POA: Diagnosis not present

## 2022-03-05 DIAGNOSIS — Z6827 Body mass index (BMI) 27.0-27.9, adult: Secondary | ICD-10-CM | POA: Diagnosis not present

## 2022-03-05 DIAGNOSIS — G479 Sleep disorder, unspecified: Secondary | ICD-10-CM | POA: Diagnosis not present

## 2022-05-13 ENCOUNTER — Telehealth: Payer: Self-pay | Admitting: *Deleted

## 2022-05-13 NOTE — Telephone Encounter (Signed)
Called and spoke with patient, he states that he no longer has his CPAP machine.  He was not aware that he had to wear it for a minimum of 4 hours per night so insurance would not pay for it and he had to turn it back in.  Nothing further needed.

## 2022-05-14 ENCOUNTER — Ambulatory Visit: Payer: 59 | Admitting: Adult Health

## 2022-05-14 ENCOUNTER — Encounter: Payer: Self-pay | Admitting: Adult Health

## 2022-05-14 VITALS — BP 112/70 | HR 73 | Temp 97.8°F | Ht 74.0 in | Wt 226.0 lb

## 2022-05-14 DIAGNOSIS — G4733 Obstructive sleep apnea (adult) (pediatric): Secondary | ICD-10-CM | POA: Diagnosis not present

## 2022-05-14 DIAGNOSIS — E669 Obesity, unspecified: Secondary | ICD-10-CM

## 2022-05-14 NOTE — Assessment & Plan Note (Signed)
Work on healthy weight loss 

## 2022-05-14 NOTE — Patient Instructions (Signed)
Restart CPAP at bedtime. Order for new CPAP .  Try to wear your CPAP every single night.   Wear all night , try to get in at least 6 hrs as needed.  Do not drive if sleepy  Work on healthy sleep regimen  Work on healthy weight loss.  Follow up in 3-4 months at [facility] office and As needed

## 2022-05-14 NOTE — Progress Notes (Signed)
@Patient  ID: Howard Celeste., male    DOB: 11/12/1965, 56 y.o.   MRN: [id number]  Chief Complaint  Patient presents with   Follow-up    Referring provider: No ref. provider found  HPI: 56 year old male never smoker seen for sleep consult Dec 19, 2019 for daytime sleepiness snoring and restless sleep found to have moderate obstructive sleep apnea   TEST/EVENTS :  HST 03/2020 moderate obstructive sleep apnea with AHI of 15.2 and SPO2 low at 82%.  05/14/2022 Follow up : OSA Patient presents for a follow-up visit.  Last seen September 09, 2020.  Patient has a moderate obstructive sleep apnea was started on CPAP therapy in 2021 after home sleep study showed moderate obstructive sleep apnea.  Patient had difficulty tolerating CPAP.  He tried several different masks.  Patient was also living in a camper at this time.  Found it very difficult to sleep and wear his CPAP.  Patient's insurance declined coverage and his CPAP was sent back to the DME company.  He also had chronic insomnia.  Patient says that he is recently moved into a home.  He is also started on trazodone which seems to be helping him sleep quite a bit.  Patient complains that sleep is still restless and wakes up feeling very tired and unrefreshed.  Has daytime sleepiness.  And has severe snoring that is disruptive to his spouse.  Wants to restart CPAP.  Says that he needs something to help him not feel as tired.  No Known Allergies  Immunization History  Administered Date(s) Administered   Influenza-Unspecified 03/28/2015, 10/30/2019    Past Medical History:  Diagnosis Date   Heartburn    Hypertension    Obstructive sleep apnea     Tobacco History: Social History   Tobacco Use  Smoking Status Never  Smokeless Tobacco Never   Counseling given: Not Answered   Outpatient Medications Prior to Visit  Medication Sig Dispense Refill   aspirin EC 81 MG tablet Take 1 tablet (81 mg total) by mouth daily. 90 tablet 3    diltiazem (CARDIZEM CD) 240 MG 24 hr capsule Take 240 mg by mouth daily.     lisinopril (ZESTRIL) 10 MG tablet TAKE 1 TABLET BY MOUTH EVERY DAY 90 tablet 0   omeprazole (PRILOSEC) 20 MG capsule Take 20 mg by mouth daily.     rosuvastatin (CRESTOR) 5 MG tablet Take 1 tablet (5 mg total) by mouth daily. 90 tablet 2   testosterone cypionate (DEPOTESTOSTERONE CYPIONATE) 200 MG/ML injection SMARTSIG:1.1 Milliliter(s) IM Once a Week     testosterone cypionate (DEPOTESTOSTERONE CYPIONATE) 200 MG/ML injection INJECT 1ML INTRAMUSCULARLY ONCE A WEEK     amoxicillin (AMOXIL) 500 MG capsule Take 1 capsule (500 mg total) by mouth 2 (two) times daily. 20 capsule 0   meclizine (ANTIVERT) 12.5 MG tablet Take 1 tablet (12.5 mg total) by mouth 2 (two) times daily as needed for dizziness. (Patient not taking: Reported on 09/09/2020) 30 tablet 0   No facility-administered medications prior to visit.     Review of Systems:   Constitutional:   No  weight loss, night sweats,  Fevers, chills, + fatigue, or  lassitude.  HEENT:   No headaches,  Difficulty swallowing,  Tooth/dental problems, or  Sore throat,                No sneezing, itching, ear ache, nasal congestion, post nasal drip,   CV:  No chest pain,  Orthopnea, PND, swelling in lower  extremities, anasarca, dizziness, palpitations, syncope.   GI  No heartburn, indigestion, abdominal pain, nausea, vomiting, diarrhea, change in bowel habits, loss of appetite, bloody stools.   Resp: No shortness of breath with exertion or at rest.  No excess mucus, no productive cough,  No non-productive cough,  No coughing up of blood.  No change in color of mucus.  No wheezing.  No chest wall deformity  Skin: no rash or lesions.  GU: no dysuria, change in color of urine, no urgency or frequency.  No flank pain, no hematuria   MS:  No joint pain or swelling.  No decreased range of motion.  No back pain.    Physical Exam  BP 112/70 (BP Location: Left Arm, Patient  Position: Sitting, Cuff Size: Large)   Pulse 73   Temp 97.8 F (36.6 C) (Oral)   Ht 6\' 2"  (1.88 m)   Wt 226 lb (102.5 kg)   SpO2 98%   BMI 29.02 kg/m   GEN: A/Ox3; pleasant , NAD, well nourished    HEENT:  [place]/AT,   NOSE-clear, THROAT-clear, no lesions, no postnasal drip or exudate noted.  Class III MP airway  NECK:  Supple w/ fair ROM; no JVD; normal carotid impulses w/o bruits; no thyromegaly or nodules palpated; no lymphadenopathy.    RESP  Clear  P & A; w/o, wheezes/ rales/ or rhonchi. no accessory muscle use, no dullness to percussion  CARD:  RRR, no m/r/g, no peripheral edema, pulses intact, no cyanosis or clubbing.  GI:   Soft & nt; nml bowel sounds; no organomegaly or masses detected.   Musco: Warm bil, no deformities or joint swelling noted.   Neuro: alert, no focal deficits noted.    Skin: Warm, no lesions or rashes    Lab Results:    BMET   BNP No results found for: "BNP"  ProBNP No results found for: "PROBNP"  Imaging: No results found.        No data to display          No results found for: "NITRICOXIDE"      Assessment & Plan:   Obstructive sleep apnea Moderate obstructive sleep apnea with significant symptom burden.  Recommend patient restart CPAP therapy.  We will begin CPAP AutoSet 5 to 15 cm H2O.  Mask of choice.  Patient education on sleep apnea and CPAP compliance discussed in detail  - discussed how weight can impact sleep and risk for sleep disordered breathing - discussed options to assist with weight loss: combination of diet modification, cardiovascular and strength training exercises   - had an extensive discussion regarding the adverse health consequences related to untreated sleep disordered breathing - specifically discussed the risks for hypertension, coronary artery disease, cardiac dysrhythmias, cerebrovascular disease, and diabetes - lifestyle modification discussed   - discussed how sleep disruption can  increase risk of accidents, particularly when driving - safe driving practices were discussed   Plan  Patient Instructions  Restart CPAP at bedtime. Order for new CPAP .  Try to wear your CPAP every single night.   Wear all night , try to get in at least 6 hrs as needed.  Do not drive if sleepy  Work on healthy sleep regimen  Work on healthy weight loss.  Follow up in 3-4 months at [facility] office and As needed         Obesity (BMI 30.0-34.9) Work on healthy weight loss     Rexene Edison, NP 05/14/2022

## 2022-05-14 NOTE — Assessment & Plan Note (Signed)
Moderate obstructive sleep apnea with significant symptom burden.  Recommend patient restart CPAP therapy.  We will begin CPAP AutoSet 5 to 15 cm H2O.  Mask of choice.  Patient education on sleep apnea and CPAP compliance discussed in detail  - discussed how weight can impact sleep and risk for sleep disordered breathing - discussed options to assist with weight loss: combination of diet modification, cardiovascular and strength training exercises   - had an extensive discussion regarding the adverse health consequences related to untreated sleep disordered breathing - specifically discussed the risks for hypertension, coronary artery disease, cardiac dysrhythmias, cerebrovascular disease, and diabetes - lifestyle modification discussed   - discussed how sleep disruption can increase risk of accidents, particularly when driving - safe driving practices were discussed   Plan  Patient Instructions  Restart CPAP at bedtime. Order for new CPAP .  Try to wear your CPAP every single night.   Wear all night , try to get in at least 6 hrs as needed.  Do not drive if sleepy  Work on healthy sleep regimen  Work on healthy weight loss.  Follow up in 3-4 months at [facility] office and As needed

## 2022-05-18 DIAGNOSIS — H6692 Otitis media, unspecified, left ear: Secondary | ICD-10-CM | POA: Diagnosis not present

## 2022-05-20 ENCOUNTER — Encounter: Payer: Self-pay | Admitting: Urology

## 2022-05-20 ENCOUNTER — Ambulatory Visit (INDEPENDENT_AMBULATORY_CARE_PROVIDER_SITE_OTHER): Payer: 59 | Admitting: Urology

## 2022-05-20 VITALS — BP 133/82 | HR 75 | Ht 74.0 in | Wt 225.6 lb

## 2022-05-20 DIAGNOSIS — E291 Testicular hypofunction: Secondary | ICD-10-CM | POA: Diagnosis not present

## 2022-05-20 DIAGNOSIS — R350 Frequency of micturition: Secondary | ICD-10-CM

## 2022-05-20 DIAGNOSIS — R972 Elevated prostate specific antigen [PSA]: Secondary | ICD-10-CM | POA: Diagnosis not present

## 2022-05-20 LAB — BLADDER SCAN AMB NON-IMAGING

## 2022-05-20 MED ORDER — MELOXICAM 7.5 MG PO TABS: 7.5000 mg | ORAL_TABLET | Freq: Every day | ORAL | 0 refills | Status: DC

## 2022-05-20 NOTE — Progress Notes (Signed)
05/20/2022 9:20 AM   Jenny Reichmann Lora Paula. 1965/12/21 [id number]  Referring provider: Baxter Hire, MD [place],  [place] [address]  Chief Complaint  Patient presents with   Elevated PSA    HPI: 56 year old male who presents today for further evaluation of markedly elevated PSA.  He had a PSA last year which was 1.22.  PSA was rechecked on 03/13/2022 and noted to be markedly elevated to 9.56.  Associated urinalysis was negative.  He does report over the past 9 9 months or so, has had increased urinary frequency and urgency.  He reports they were at a concert recently and he had to go 5 times within an hour.  He has flares of frequency.  He voids large volumes every time he does void and does feel he empties his bladder.  His stream is good.  He denies any dysuria or gross hematuria.  He does drink mostly diet sodas.  He is a personal history of hypogonadism, initially managed with testosterone gel several years ago.  He did not like the gels and ended up transferring his care to a private clinic called [facility] in [place].  He has been on injectable testosterone for more than a year, 100 mg/week.  He does note that about 2 months ago, he was put on Flomax but is not really sure why.  They have been drawing labs but not doing rectal exams.  He does not know if his PSA has been rising over the past year.  Records release was signed today.   PMH: Past Medical History:  Diagnosis Date   Heartburn    Hypertension    Obstructive sleep apnea     Surgical History: Past Surgical History:  Procedure Laterality Date   none      Home Medications:  Allergies as of 05/20/2022   No Known Allergies      Medication List        Accurate as of May 20, 2022  9:20 AM. If you have any questions, ask your nurse or doctor.          aspirin EC 81 MG tablet Take 1 tablet (81 mg total) by mouth daily.   cefdinir 300 MG capsule Commonly known as:  OMNICEF Take 300 mg by mouth every 12 (twelve) hours.   diltiazem 240 MG 24 hr capsule Commonly known as: CARDIZEM CD Take 240 mg by mouth daily.   lisinopril 10 MG tablet Commonly known as: ZESTRIL TAKE 1 TABLET BY MOUTH EVERY DAY   meloxicam 7.5 MG tablet Commonly known as: Mobic Take 1 tablet (7.5 mg total) by mouth daily. Started by: Hollice Espy, MD   omeprazole 20 MG capsule Commonly known as: PRILOSEC Take 20 mg by mouth daily.   rosuvastatin 5 MG tablet Commonly known as: CRESTOR Take 1 tablet (5 mg total) by mouth daily.   testosterone cypionate 200 MG/ML injection Commonly known as: DEPOTESTOSTERONE CYPIONATE INJECT 1ML INTRAMUSCULARLY ONCE A WEEK   testosterone cypionate 200 MG/ML injection Commonly known as: DEPOTESTOSTERONE CYPIONATE SMARTSIG:1.1 Milliliter(s) IM Once a Week        Allergies: No Known Allergies  Family History: Family History  Problem Relation Age of Onset   Cancer - Lung Father    Heart failure Father    Heart attack Father 25   Bipolar disorder Mother    Hyperlipidemia Mother    Hypertension Mother    Breast cancer Maternal Grandmother     Social History:  reports that he has never smoked. He has never used smokeless tobacco. He reports that he does not drink alcohol and does not use drugs.   Physical Exam: BP 133/82   Pulse 75   Ht 6\' 2"  (1.88 m)   Wt 225 lb 9.6 oz (102.3 kg)   BMI 28.97 kg/m   Constitutional:  Alert and oriented, No acute distress. HEENT: [place] AT, moist mucus membranes.  Trachea midline, no masses. Cardiovascular: No clubbing, cyanosis, or edema. Respiratory: Normal respiratory effort, no increased work of breathing. Rectal: Normal sphincter tone.  Enlarged prostate, 50+ cc, nontender without nodularity Skin: No rashes, bruises or suspicious lesions. Neurologic: Grossly intact, no focal deficits, moving all 4 extremities. Psychiatric: Normal mood and affect.  Laboratory Data: Lab Results   Component Value Date   WBC 14.4 (H) 12/29/2021   HGB 16.1 12/29/2021   HCT 49.4 12/29/2021   MCV 83.4 12/29/2021   PLT 221 12/29/2021    Lab Results  Component Value Date   CREATININE 1.16 12/29/2021    Lab Results  Component Value Date   HGBA1C 5.4 10/21/2016    Pertinent Imaging: Results for orders placed or performed in visit on 05/20/22  Bladder Scan (Post Void Residual) in office  Result Value Ref Range   Scan Result 36ML     Assessment & Plan:    1. Elevated PSA  We reviewed the implications of an elevated PSA and the uncertainty surrounding it. In general, a man's PSA increases with age and is produced by both normal and cancerous prostate tissue. Differential for elevated PSA is BPH, prostate cancer, infection, recent intercourse/ejaculation, prostate infarction, recent urethroscopic manipulation (foley placement/cystoscopy) and prostatitis. Management of an elevated PSA can include observation or prostate biopsy and wediscussed this in detail. We discussed that indications for prostate biopsy are defined by age and race specific PSA cutoffs as well as a PSA velocity of 0.75/year.  Suspect there are multiple contributing factors including his testosterone replacement therapy.  He may have an underlying inflammatory component given marked rise.  He also has prostamegaly on exam today.  For the time being, I would like him to stop testosterone, continue Flomax with the addition of meloxicam to help reduce prostatic inflammation.  We will plan on rechecking a PSA in a month.  If his PSA is trending downwards, we may follow him conservatively.  If his PSA fails to improve, would recommend biopsy at that point in time.  He is agreeable this plan.  We also had him sign a records release for [facility], suspect that they have been checking a PSA and potentially started Flomax in light of the above  - Bladder Scan (Post Void Residual) in office - PSA; Future  2. Urinary  frequency Suspicious for prostatitis  Emptying adequately which is reassuring - Bladder Scan (Post Void Residual) in office  3. Hypogonadism in male Advised to stop testosterone for the time being in light of the above  Plan to recheck a PSA in a month after the above and the we will decide on disposition for continuation or discontinuation of testosterone at that point - Bladder Scan (Post Void Residual) in office   Return in about 1 month (around 06/20/2022) for Review records, PSA prior.  Hollice Espy, MD  [facility] [address] [place], [place] [address] [phone]

## 2022-06-02 DIAGNOSIS — G4733 Obstructive sleep apnea (adult) (pediatric): Secondary | ICD-10-CM | POA: Diagnosis not present

## 2022-06-08 ENCOUNTER — Other Ambulatory Visit: Payer: 59

## 2022-06-08 DIAGNOSIS — R972 Elevated prostate specific antigen [PSA]: Secondary | ICD-10-CM | POA: Diagnosis not present

## 2022-06-09 LAB — PSA: Prostate Specific Ag, Serum: 9.4 ng/mL — ABNORMAL HIGH (ref 0.0–4.0)

## 2022-06-16 ENCOUNTER — Other Ambulatory Visit: Payer: Self-pay | Admitting: Urology

## 2022-06-16 ENCOUNTER — Other Ambulatory Visit: Payer: 59

## 2022-06-23 ENCOUNTER — Ambulatory Visit: Payer: 59 | Admitting: Urology

## 2022-06-23 VITALS — BP 118/80 | HR 65 | Ht 74.0 in | Wt 208.0 lb

## 2022-06-23 DIAGNOSIS — R972 Elevated prostate specific antigen [PSA]: Secondary | ICD-10-CM

## 2022-06-23 DIAGNOSIS — R350 Frequency of micturition: Secondary | ICD-10-CM

## 2022-06-23 DIAGNOSIS — E291 Testicular hypofunction: Secondary | ICD-10-CM | POA: Diagnosis not present

## 2022-06-23 MED ORDER — TAMSULOSIN HCL 0.4 MG PO CAPS: 0.4000 mg | ORAL_CAPSULE | Freq: Every day | ORAL | 11 refills | Status: DC

## 2022-06-23 NOTE — Patient Instructions (Signed)

## 2022-06-23 NOTE — Progress Notes (Signed)
06/23/2022 4:11 PM   Howard Smith. 08/31/1965 [id number]  Referring provider: No referring provider defined for this encounter.  Chief Complaint  Patient presents with   Elevated PSA    HPI: 56 year old male with personal history of hypogonadism previously managed on injectable testosterone by [facility] noted to have a markedly elevated PSA.  Initially, his PSA was 12.5 on 03/18/2022 as reported by labs at [facility].  It trended back down to 9 but is remains stably elevated at this number, repeated 2 weeks ago.  He continues to have urinary frequency which is not improving.  He has not been taking NSAIDs on a regular basis.  He ran out of Flomax last week, call their office to get a prescription but never heard back from [place].  He does not notice any difference since coming off of testosterone.  He never really felt different when he was on the medication.   PMH: Past Medical History:  Diagnosis Date   Heartburn    Hypertension    Obstructive sleep apnea     Surgical History: Past Surgical History:  Procedure Laterality Date   none      Home Medications:  Allergies as of 06/23/2022   No Known Allergies      Medication List        Accurate as of June 23, 2022  4:11 PM. If you have any questions, ask your nurse or doctor.          STOP taking these medications    cefdinir 300 MG capsule Commonly known as: OMNICEF   testosterone cypionate 200 MG/ML injection Commonly known as: DEPOTESTOSTERONE CYPIONATE       TAKE these medications    aspirin EC 81 MG tablet Take 1 tablet (81 mg total) by mouth daily.   diltiazem 240 MG 24 hr capsule Commonly known as: CARDIZEM CD Take 240 mg by mouth daily.   lisinopril 10 MG tablet Commonly known as: ZESTRIL TAKE 1 TABLET BY MOUTH EVERY DAY   meloxicam 7.5 MG tablet Commonly known as: Mobic Take 1 tablet (7.5 mg total) by mouth daily.   omeprazole 20 MG capsule Commonly known as: PRILOSEC Take  20 mg by mouth daily.   rosuvastatin 5 MG tablet Commonly known as: CRESTOR Take 1 tablet (5 mg total) by mouth daily.   tamsulosin 0.4 MG Caps capsule Commonly known as: FLOMAX Take 1 capsule (0.4 mg total) by mouth daily.        Allergies: No Known Allergies  Family History: Family History  Problem Relation Age of Onset   Cancer - Lung Father    Heart failure Father    Heart attack Father 84   Bipolar disorder Mother    Hyperlipidemia Mother    Hypertension Mother    Breast cancer Maternal Grandmother     Social History:  reports that he has never smoked. He has never used smokeless tobacco. He reports that he does not drink alcohol and does not use drugs.   Physical Exam: BP 118/80   Pulse 65   Ht 6\' 2"  (1.88 m)   Wt 208 lb (94.3 kg)   BMI 26.71 kg/m   Constitutional:  Alert and oriented, No acute distress. HEENT: [place] AT, moist mucus membranes.  Trachea midline, no masses. Skin: No rashes, bruises or suspicious lesions. Neurologic: Grossly intact, no focal deficits, moving all 4 extremities. Psychiatric: Normal mood and affect.  Laboratory Data: Lab Results  Component Value Date   WBC  14.4 (H) 12/29/2021   HGB 16.1 12/29/2021   HCT 49.4 12/29/2021   MCV 83.4 12/29/2021   PLT 221 12/29/2021    Lab Results  Component Value Date   CREATININE 1.16 12/29/2021    Lab Results  Component Value Date   HGBA1C 5.4 10/21/2016    Urinalysis Negative  Assessment & Plan:    1. Elevated PSA PSA remains markedly elevated x 1 month despite cessation of testosterone.    Recommend PBx  We discussed prostate biopsy in detail including the procedure itself, the risks of blood in the urine, stool, and ejaculate, serious infection, and discomfort. He is willing to proceed with this as discussed.   Plan to recheck PSA on last time prior to biopsy, will cancel if trending down - PSA; Future  2. Urinary frequency Refilled flomax  Will address further pending  #1 workup if symptoms fail to resolve  3. Hypogonadism in male Continue to hold testosterone, possibly contributing factor   Return for prostate biopsy in 3-4 weeks; also lab visit for PSA a few days prior to biopsy.  Hollice Espy, MD  [facility] [address], [place], [place] [address] [phone]

## 2022-06-24 DIAGNOSIS — G4733 Obstructive sleep apnea (adult) (pediatric): Secondary | ICD-10-CM | POA: Diagnosis not present

## 2022-07-02 DIAGNOSIS — G4733 Obstructive sleep apnea (adult) (pediatric): Secondary | ICD-10-CM | POA: Diagnosis not present

## 2022-07-23 ENCOUNTER — Other Ambulatory Visit: Payer: 59

## 2022-07-23 DIAGNOSIS — R972 Elevated prostate specific antigen [PSA]: Secondary | ICD-10-CM

## 2022-07-24 DIAGNOSIS — G4733 Obstructive sleep apnea (adult) (pediatric): Secondary | ICD-10-CM | POA: Diagnosis not present

## 2022-07-24 LAB — PSA: Prostate Specific Ag, Serum: 6.1 ng/mL — ABNORMAL HIGH (ref 0.0–4.0)

## 2022-07-28 ENCOUNTER — Encounter: Payer: Self-pay | Admitting: Urology

## 2022-07-28 ENCOUNTER — Ambulatory Visit: Payer: 59 | Admitting: Urology

## 2022-07-28 VITALS — BP 107/69 | HR 81 | Ht 74.0 in | Wt 208.0 lb

## 2022-07-28 DIAGNOSIS — E291 Testicular hypofunction: Secondary | ICD-10-CM | POA: Diagnosis not present

## 2022-07-28 DIAGNOSIS — R350 Frequency of micturition: Secondary | ICD-10-CM

## 2022-07-28 DIAGNOSIS — R972 Elevated prostate specific antigen [PSA]: Secondary | ICD-10-CM | POA: Diagnosis not present

## 2022-07-28 NOTE — Progress Notes (Signed)
I, DeAsia L Maxie,acting as a scribe for Hollice Espy, MD.,have documented all relevant documentation on the behalf of Hollice Espy, MD,as directed by  Hollice Espy, MD while in the presence of Hollice Espy, MD.   07/28/22 9:26 AM   Howard Smith. Nov 30, 1965 [id number]   Chief Complaint  Patient presents with   Elevated PSA    HPI: 57 year-old male with a personal history of hypogonadism previously managed on injectable testosterone and elevated PSA.   He was originally scheduled for prostate biopsy, but his PSA continues to trend downward. Most recent PSA was 6.1 on 07/23/22, was previously 9.4 a month ago and as high as 12 upon initial evaluation.   He also had some irritative urinary symptoms and was placed on Flomax, which initially helped.  Today he states he still has frequency and does not notice much improvement with the Flomax.   PMH: Past Medical History:  Diagnosis Date   Heartburn    Hypertension    Obstructive sleep apnea     Surgical History: Past Surgical History:  Procedure Laterality Date   none      Home Medications:  Allergies as of 07/28/2022   No Known Allergies      Medication List        Accurate as of July 28, 2022  9:26 AM. If you have any questions, ask your nurse or doctor.          STOP taking these medications    tamsulosin 0.4 MG Caps capsule Commonly known as: FLOMAX Stopped by: Hollice Espy, MD       TAKE these medications    aspirin EC 81 MG tablet Take 1 tablet (81 mg total) by mouth daily.   diltiazem 240 MG 24 hr capsule Commonly known as: CARDIZEM CD Take 240 mg by mouth daily.   lisinopril 10 MG tablet Commonly known as: ZESTRIL TAKE 1 TABLET BY MOUTH EVERY DAY   meloxicam 7.5 MG tablet Commonly known as: Mobic Take 1 tablet (7.5 mg total) by mouth daily.   omeprazole 20 MG capsule Commonly known as: PRILOSEC Take 20 mg by mouth daily.   rosuvastatin 5 MG tablet Commonly known  as: CRESTOR Take 1 tablet (5 mg total) by mouth daily.        Family History: Family History  Problem Relation Age of Onset   Cancer - Lung Father    Heart failure Father    Heart attack Father 30   Bipolar disorder Mother    Hyperlipidemia Mother    Hypertension Mother    Breast cancer Maternal Grandmother     Social History:  reports that he has never smoked. He has never used smokeless tobacco. He reports that he does not drink alcohol and does not use drugs.   Physical Exam: BP 107/69   Pulse 81   Ht 6\' 2"  (1.88 m)   Wt 208 lb (94.3 kg)   BMI 26.71 kg/m   Constitutional:  Alert and oriented, No acute distress. HEENT: [place] AT, moist mucus membranes.  Trachea midline, no masses. Neurologic: Grossly intact, no focal deficits, moving all 4 extremities. Psychiatric: Normal mood and affect.  Assessment & Plan:    Elevated PSA - PSA continues to trend downwards which is reassuring. Elected to defer prostate biopsy today. We will plan to repeat PSA in 3 months, will contact him with results and follow-up plan.    2. Urinary Frequency - No real difference on Flomax. He prefers to  stop medication.  3. Hypogonadism - Testosterone being held, no difference in symptoms since being off this medication. Continue to hold in light of elevated PSA.  Return for PSA 3 months.- will call with results and f/u plan  I have reviewed the above documentation for accuracy and completeness, and I agree with the above.   Hollice Espy, MD   [facility] [address], [place], [place] [address] [phone]

## 2022-07-28 NOTE — Patient Instructions (Signed)
  Stop Flomax 

## 2022-08-02 DIAGNOSIS — G4733 Obstructive sleep apnea (adult) (pediatric): Secondary | ICD-10-CM | POA: Diagnosis not present

## 2022-08-24 DIAGNOSIS — G4733 Obstructive sleep apnea (adult) (pediatric): Secondary | ICD-10-CM | POA: Diagnosis not present

## 2022-09-02 DIAGNOSIS — G4733 Obstructive sleep apnea (adult) (pediatric): Secondary | ICD-10-CM | POA: Diagnosis not present

## 2022-09-10 ENCOUNTER — Encounter: Payer: Self-pay | Admitting: Urology

## 2022-09-10 ENCOUNTER — Ambulatory Visit: Payer: 59 | Admitting: Urology

## 2022-09-10 VITALS — BP 131/81 | HR 74 | Ht 74.0 in | Wt 218.0 lb

## 2022-09-10 DIAGNOSIS — R399 Unspecified symptoms and signs involving the genitourinary system: Secondary | ICD-10-CM | POA: Diagnosis not present

## 2022-09-10 DIAGNOSIS — R102 Pelvic and perineal pain: Secondary | ICD-10-CM | POA: Diagnosis not present

## 2022-09-10 DIAGNOSIS — N410 Acute prostatitis: Secondary | ICD-10-CM

## 2022-09-10 DIAGNOSIS — R300 Dysuria: Secondary | ICD-10-CM

## 2022-09-10 DIAGNOSIS — R972 Elevated prostate specific antigen [PSA]: Secondary | ICD-10-CM | POA: Diagnosis not present

## 2022-09-10 LAB — URINALYSIS, COMPLETE
Bilirubin, UA: NEGATIVE
Glucose, UA: NEGATIVE
Ketones, UA: NEGATIVE
Leukocytes,UA: NEGATIVE
Nitrite, UA: NEGATIVE
Protein,UA: NEGATIVE
RBC, UA: NEGATIVE
Specific Gravity, UA: 1.02 (ref 1.005–1.030)
Urobilinogen, Ur: 0.2 mg/dL (ref 0.2–1.0)
pH, UA: 5.5 (ref 5.0–7.5)

## 2022-09-10 LAB — MICROSCOPIC EXAMINATION

## 2022-09-10 MED ORDER — MELOXICAM 7.5 MG PO TABS: 7.5000 mg | ORAL_TABLET | Freq: Every day | ORAL | 1 refills | Status: AC

## 2022-09-10 NOTE — Progress Notes (Addendum)
Haze Rushing Plume,acting as a scribe for Hollice Espy, MD.,have documented all relevant documentation on the behalf of Hollice Espy, MD,as directed by  Hollice Espy, MD while in the presence of Hollice Espy, MD.  09/10/2022 9:13 AM   Howard Smith. 1966/03/24 [id number]   Chief Complaint  Patient presents with   discuss other options    HPI: 57 year old male who returns today sooner than anticipated, requesting this appointment to further discuss his options.  He initially presented with a PSA of 12.5 on 03/18/2022 after initiating testosterone therapy at [facility]. In the interim, his PSA slowly continued to trend downwards, most recently to 6.1 on 07/23/2022 after discontinuing testosterone. At the time, he was also having some irritative urinary symptoms, including urinary frequency. He has tried Flomax, which was not particularly helpful. He had not been using NSAIDs on a regular basis. He is scheduled for a repeat PSA in two months.   Today, he has reported experiencing mild intermittent discomfort during urination. He has indicated that his fluid intake primarily consists of Dr. Malachi Bonds Zero rather than water. Additionally, he has been managing their discomfort by taking Advil once daily.  He is wondering if he should a PSA sooner.  Results for orders placed or performed in visit on 09/10/22  Microscopic Examination   Urine  Result Value Ref Range   WBC, UA 0-5 0 - 5 /hpf   RBC, Urine 0-2 0 - 2 /hpf   Epithelial Cells (non renal) 0-10 0 - 10 /hpf   Mucus, UA Present (A) Not Estab.   Bacteria, UA Moderate (A) None seen/Few  Urinalysis, Complete  Result Value Ref Range   Specific Gravity, UA 1.020 1.005 - 1.030   pH, UA 5.5 5.0 - 7.5   Color, UA Yellow Yellow   Appearance Ur Clear Clear   Leukocytes,UA Negative Negative   Protein,UA Negative Negative/Trace   Glucose, UA Negative Negative   Ketones, UA Negative Negative   RBC, UA Negative Negative    Bilirubin, UA Negative Negative   Urobilinogen, Ur 0.2 0.2 - 1.0 mg/dL   Nitrite, UA Negative Negative   Microscopic Examination See below:     PMH: Past Medical History:  Diagnosis Date   Heartburn    Hypertension    Obstructive sleep apnea     Surgical History: Past Surgical History:  Procedure Laterality Date   none      Home Medications:  Allergies as of 09/10/2022   No Known Allergies      Medication List        Accurate as of September 10, 2022  9:13 AM. If you have any questions, ask your nurse or doctor.          aspirin EC 81 MG tablet Take 1 tablet (81 mg total) by mouth daily.   diltiazem 240 MG 24 hr capsule Commonly known as: CARDIZEM CD Take 240 mg by mouth daily.   lisinopril 10 MG tablet Commonly known as: ZESTRIL TAKE 1 TABLET BY MOUTH EVERY DAY   meloxicam 7.5 MG tablet Commonly known as: Mobic Take 1 tablet (7.5 mg total) by mouth daily.   omeprazole 20 MG capsule Commonly known as: PRILOSEC Take 20 mg by mouth daily.   rosuvastatin 5 MG tablet Commonly known as: CRESTOR Take 1 tablet (5 mg total) by mouth daily.   traZODone 50 MG tablet Commonly known as: DESYREL Take 50 mg by mouth at bedtime.        Family  History: Family History  Problem Relation Age of Onset   Cancer - Lung Father    Heart failure Father    Heart attack Father 42   Bipolar disorder Mother    Hyperlipidemia Mother    Hypertension Mother    Breast cancer Maternal Grandmother     Social History:  reports that he has never smoked. He has never used smokeless tobacco. He reports that he does not drink alcohol and does not use drugs.   Physical Exam: BP 131/81   Pulse 74   Ht 6' 2"$  (1.88 m)   Wt 218 lb (98.9 kg)   BMI 27.99 kg/m   Constitutional:  Alert and oriented, No acute distress. HEENT: [place] AT, moist mucus membranes.  Trachea midline, no masses. Neurologic: Grossly intact, no focal deficits, moving all 4 extremities. Psychiatric: Normal  mood and affect.  Assessment & Plan:    1. Elevated PSA - Continue to recommend trending this as it trends downwards. -Discouraged repeat PSA today as it has not been enough time to have a appropriate appreciation of the overall trend especially in light of ongoing urinary symptoms -Recommend continued abstinence from testosterone  2. Prostatitis - He continues to have some irritative urinary symptoms and dull pelvic pain, all of which go along with inflammatory prostatitis.  - He has been taking Advil but not regularly. - We will switch him over to meloxicam 7.5 mg daily. - His UA today is negative with no evidence of an infectious process.  - Follow up in April with a PSA prior. - If his PSA remains elevated at that point, we will consider prostate MRI versus biopsy.   Return in about 2 months (around 11/09/2022) for repeat PSA.  I have reviewed the above documentation for accuracy and completeness, and I agree with the above.   Hollice Espy, MD   [facility] [address] [place], [place] [address] [phone]

## 2022-10-01 DIAGNOSIS — G4733 Obstructive sleep apnea (adult) (pediatric): Secondary | ICD-10-CM | POA: Diagnosis not present

## 2022-10-19 DIAGNOSIS — G4733 Obstructive sleep apnea (adult) (pediatric): Secondary | ICD-10-CM | POA: Diagnosis not present

## 2022-10-27 ENCOUNTER — Other Ambulatory Visit: Payer: 59

## 2022-10-27 DIAGNOSIS — R972 Elevated prostate specific antigen [PSA]: Secondary | ICD-10-CM | POA: Diagnosis not present

## 2022-10-28 ENCOUNTER — Encounter: Payer: Self-pay | Admitting: Family Medicine

## 2022-10-28 LAB — PSA: Prostate Specific Ag, Serum: 5.1 ng/mL — ABNORMAL HIGH (ref 0.0–4.0)

## 2022-11-01 DIAGNOSIS — G4733 Obstructive sleep apnea (adult) (pediatric): Secondary | ICD-10-CM | POA: Diagnosis not present

## 2022-11-02 ENCOUNTER — Other Ambulatory Visit: Payer: Self-pay | Admitting: Urology

## 2022-11-02 DIAGNOSIS — R300 Dysuria: Secondary | ICD-10-CM

## 2022-11-02 DIAGNOSIS — N410 Acute prostatitis: Secondary | ICD-10-CM

## 2022-11-02 DIAGNOSIS — R972 Elevated prostate specific antigen [PSA]: Secondary | ICD-10-CM

## 2022-11-02 NOTE — Telephone Encounter (Signed)
Patient states he is still taking Meloxicam every day. Symptoms have not changed. He is still having pain with urination off and on like he was when seen in February. Please advise.

## 2022-11-04 NOTE — Telephone Encounter (Signed)
Patient advised and scheduled appointment for 11/10/22

## 2022-11-10 ENCOUNTER — Ambulatory Visit: Payer: 59 | Admitting: Physician Assistant

## 2022-11-10 ENCOUNTER — Encounter: Payer: Self-pay | Admitting: Physician Assistant

## 2022-11-10 VITALS — BP 102/69 | HR 81 | Ht 74.0 in | Wt 224.0 lb

## 2022-11-10 DIAGNOSIS — R972 Elevated prostate specific antigen [PSA]: Secondary | ICD-10-CM

## 2022-11-10 DIAGNOSIS — R300 Dysuria: Secondary | ICD-10-CM | POA: Diagnosis not present

## 2022-11-10 LAB — URINALYSIS, COMPLETE
Bilirubin, UA: NEGATIVE
Glucose, UA: NEGATIVE
Ketones, UA: NEGATIVE
Leukocytes,UA: NEGATIVE
Nitrite, UA: NEGATIVE
Protein,UA: NEGATIVE
RBC, UA: NEGATIVE
Specific Gravity, UA: 1.025 (ref 1.005–1.030)
Urobilinogen, Ur: 0.2 mg/dL (ref 0.2–1.0)
pH, UA: 5.5 (ref 5.0–7.5)

## 2022-11-10 LAB — MICROSCOPIC EXAMINATION

## 2022-11-10 LAB — BLADDER SCAN AMB NON-IMAGING: Scan Result: 77

## 2022-11-10 MED ORDER — DIAZEPAM 2 MG PO TABS: ORAL_TABLET | ORAL | 0 refills | Status: DC

## 2022-11-10 MED ORDER — SULFAMETHOXAZOLE-TRIMETHOPRIM 800-160 MG PO TABS: 1.0000 | ORAL_TABLET | Freq: Two times a day (BID) | ORAL | 0 refills | Status: DC

## 2022-11-10 NOTE — Progress Notes (Signed)
11/10/2022 2:11 PM   Howard Smith. [birth year] [id number]  CC: Chief Complaint  Patient presents with   Dysuria   HPI: Howard Smith. is a 57 y.o. male with PMH hypogonadism previously on IM testosterone and elevated PSA who presents today for evaluation of persistent dysuria.   PSA has been downtrending since last year with a peak of 12.5, most recently 5.1 on 4/08/28/22. He saw Dr. Apolinar Smith in clinic most recently on 09/10/2022 with reports of intermittent dysuria and dull pelvic pain and was started on Mobic 7.5mg  daily for inflammatory prostatitis.  Today he reports his dysuria and bladder pressure after voiding have not improved despite Mobic.  He has run out of the medication.  He also has noticed a few episodes of retrograde ejaculation over the past several months.  He denies fevers, gross hematuria, hematospermia, or perineal pain/pressure.  He admits to some malodorous urine as well.  In-office UA today and microscopy today pan-negative.  PMH: Past Medical History:  Diagnosis Date   Heartburn    Hypertension    Obstructive sleep apnea     Surgical History: Past Surgical History:  Procedure Laterality Date   none      Home Medications:  Allergies as of 11/10/2022   No Known Allergies      Medication List        Accurate as of November 10, 2022  2:11 PM. If you have any questions, ask your nurse or doctor.          aspirin EC 81 MG tablet Take 1 tablet (81 mg total) by mouth daily.   diltiazem 240 MG 24 hr capsule Commonly known as: CARDIZEM CD Take 240 mg by mouth daily.   lisinopril 10 MG tablet Commonly known as: ZESTRIL TAKE 1 TABLET BY MOUTH EVERY DAY   meloxicam 7.5 MG tablet Commonly known as: Mobic Take 1 tablet (7.5 mg total) by mouth daily.   omeprazole 20 MG capsule Commonly known as: PRILOSEC Take 20 mg by mouth daily.   rosuvastatin 5 MG tablet Commonly known as: CRESTOR Take 1 tablet (5 mg total) by mouth daily.    traZODone 50 MG tablet Commonly known as: DESYREL Take 50 mg by mouth at bedtime.        Allergies:  No Known Allergies  Family History: Family History  Problem Relation Age of Onset   Cancer - Lung Father    Heart failure Father    Heart attack Father 60   Bipolar disorder Mother    Hyperlipidemia Mother    Hypertension Mother    Breast cancer Maternal Grandmother     Social History:   reports that he has never smoked. He has never used smokeless tobacco. He reports that he does not drink alcohol and does not use drugs.  Physical Exam: BP 102/69   Pulse 81   Ht  (1.88 m)   Wt 224 lb (101.6 kg)   BMI 28.76 kg/m   Constitutional:  Alert and oriented, no acute distress, nontoxic appearing HEENT: [place], AT Cardiovascular: No clubbing, cyanosis, or edema Respiratory: Normal respiratory effort, no increased work of breathing Skin: No rashes, bruises or suspicious lesions Neurologic: Grossly intact, no focal deficits, moving all 4 extremities Psychiatric: Normal mood and affect  Laboratory Data: Results for orders placed or performed in visit on 11/10/22  Microscopic Examination   Urine  Result Value Ref Range   WBC, UA 0-5 0 - 5 /hpf   RBC, Urine  0-2 0 - 2 /hpf   Epithelial Cells (non renal) 0-10 0 - 10 /hpf   Mucus, UA Present (A) Not Estab.   Bacteria, UA Few None seen/Few  Urinalysis, Complete  Result Value Ref Range   Specific Gravity, UA 1.025 1.005 - 1.030   pH, UA 5.5 5.0 - 7.5   Color, UA Yellow Yellow   Appearance Ur Clear Clear   Leukocytes,UA Negative Negative   Protein,UA Negative Negative/Trace   Glucose, UA Negative Negative   Ketones, UA Negative Negative   RBC, UA Negative Negative   Bilirubin, UA Negative Negative   Urobilinogen, Ur 0.2 0.2 - 1.0 mg/dL   Nitrite, UA Negative Negative   Microscopic Examination See below:   Bladder Scan (Post Void Residual) in office  Result Value Ref Range   Scan Result 77    Assessment & Plan:    1. Dysuria Dysuria and bladder pressure after voiding persistent despite treatment for inflammatory prostatitis.  His UA is bland today, but I recommend empiric antibiotics x 4 weeks for treatment of a chronic bacterial prostatitis instead.  He is in agreement with this plan.  We discussed taking probiotics as well to reduce GI side effects.  We also discussed pursuing prostate MRI since his PSA remains elevated, though downtrending.  Overall low suspicion for prostate abscess, but this would definitively evaluate for that diagnosis as well.  He denies metal implants or devices, but reports some mild claustrophobia, will prescribe 1 tablet of Valium to take pretest.  We discussed that he will need a driver the day of the test.  He expressed understanding. - Urinalysis, Complete - Bladder Scan (Post Void Residual) in office - sulfamethoxazole-trimethoprim (BACTRIM DS) 800-160 MG tablet; Take 1 tablet by mouth 2 (two) times daily for 28 days.  Dispense: 56 tablet; Refill: 0 - CULTURE, URINE COMPREHENSIVE - diazepam (VALIUM) 2 MG tablet; Take one tablet by mouth 30-60 minutes prior to procedure. Do not operate heavy machinery while on this medication.  Dispense: 1 tablet; Refill: 0 - MR PROSTATE W WO CONTRAST; Future  2. Elevated PSA See above. - sulfamethoxazole-trimethoprim (BACTRIM DS) 800-160 MG tablet; Take 1 tablet by mouth 2 (two) times daily for 28 days.  Dispense: 56 tablet; Refill: 0 - CULTURE, URINE COMPREHENSIVE - diazepam (VALIUM) 2 MG tablet; Take one tablet by mouth 30-60 minutes prior to procedure. Do not operate heavy machinery while on this medication.  Dispense: 1 tablet; Refill: 0 - MR PROSTATE W WO CONTRAST; Future   Return for Will call with results.  Howard Ching, PA-C  [facility] [place] [address] [place], [place] [address] [phone]

## 2022-11-10 NOTE — Patient Instructions (Signed)
-  START Bactrim (sulfamethoxazole-trimethoprim) antibiotic twice daily x4 weeks. Please also pick up a probiotic at the pharmacy to take as well, to replenish your gut bacteria and reduce your risk for GI side effects on this medication. -The imaging department will call to schedule your MRI. I've sent in a prescription for Valium to take about 1 hour prior to the test. Please make sure you have a driver that day.

## 2022-11-14 LAB — CULTURE, URINE COMPREHENSIVE

## 2022-11-19 DIAGNOSIS — G4733 Obstructive sleep apnea (adult) (pediatric): Secondary | ICD-10-CM | POA: Diagnosis not present

## 2022-11-23 ENCOUNTER — Other Ambulatory Visit: Payer: Self-pay | Admitting: Physician Assistant

## 2022-11-23 DIAGNOSIS — R300 Dysuria: Secondary | ICD-10-CM

## 2022-11-23 MED ORDER — CIPROFLOXACIN HCL 500 MG PO TABS
500.0000 mg | ORAL_TABLET | Freq: Two times a day (BID) | ORAL | 0 refills | Status: AC
Start: 2022-11-23 — End: 2022-12-21

## 2022-12-01 DIAGNOSIS — G4733 Obstructive sleep apnea (adult) (pediatric): Secondary | ICD-10-CM | POA: Diagnosis not present

## 2022-12-03 ENCOUNTER — Encounter: Payer: Self-pay | Admitting: Adult Health

## 2022-12-03 ENCOUNTER — Ambulatory Visit: Payer: 59 | Admitting: Adult Health

## 2022-12-03 ENCOUNTER — Encounter: Payer: Self-pay | Admitting: Physician Assistant

## 2022-12-03 VITALS — BP 122/60 | HR 77 | Temp 97.6°F | Ht 74.0 in | Wt 229.0 lb

## 2022-12-03 DIAGNOSIS — G4733 Obstructive sleep apnea (adult) (pediatric): Secondary | ICD-10-CM

## 2022-12-03 NOTE — Assessment & Plan Note (Signed)
Moderate obstructive sleep apnea.  Patient has tried CPAP multiple times over the last 3 years.  Unfortunately is been unable to tolerate despite different CPAP pressure and multiple mask trials.  We discussed alternative treatment options including weight loss, positional sleep and oral appliance.  Patient is not interested in the inspire device.  Will refer to orthodontics for an oral appliance evaluation  Plan  Patient Instructions  Refer to Orthodontics for oral appliance . Dr. Myrtis Ser.  Do not drive if sleepy  Work on healthy sleep regimen  Work on healthy weight loss.  Follow up in 1 year and As needed

## 2022-12-03 NOTE — Progress Notes (Signed)
Reviewed and agree with assessment/plan.   Coralyn Helling, MD [facility] 12/03/2022, 10:50 AM Pager:  [phone]

## 2022-12-03 NOTE — Progress Notes (Signed)
@Patient  ID: Howard Smith., male    DOB: Oct 07, 1965, 57 y.o.   MRN: [id number]  Chief Complaint  Patient presents with   Follow-up    Referring provider: No ref. provider found  HPI: 57 year old male never smoker seen for sleep consult Dec 19, 2019 found to have moderate obstructive sleep apnea started on CPAP  TEST/EVENTS :  HST 03/2020 moderate obstructive sleep apnea with AHI of 15.2 and SPO2 low at 82%.   12/03/2022 Follow up ; OSA  Patient returns for a 33-month follow-up.  Patient was seen last visit for follow-up of moderate sleep apnea.  Patient was started on CPAP in 2021 after home sleep study showed moderate sleep apnea the AHI 15.2/hour and SpO2 low at 82%.  Patient has tried CPAP multiple times.  He is use multiple mask including several different fullface several different nasal mask but unfortunately has been unable to tolerate.  He had recently gotten a new CPAP machine last year to see if this made a difference he continues to be unable to tolerate.  We discussed other treatment options including weight loss positional sleep and oral appliance.  We did discuss inspire device briefly.  Patient does not want to undergo a surgical procedure.  Is interested in oral appliance referral.       Allergies  Allergen Reactions   Bactrim [Sulfamethoxazole-Trimethoprim] Rash    Immunization History  Administered Date(s) Administered   Influenza-Unspecified 03/28/2015, 10/30/2019    Past Medical History:  Diagnosis Date   Heartburn    Hypertension    Obstructive sleep apnea     Tobacco History: Social History   Tobacco Use  Smoking Status Never  Smokeless Tobacco Never   Counseling given: Not Answered   Outpatient Medications Prior to Visit  Medication Sig Dispense Refill   aspirin EC 81 MG tablet Take 1 tablet (81 mg total) by mouth daily. 90 tablet 3   ciprofloxacin (CIPRO) 500 MG tablet Take 1 tablet (500 mg total) by mouth 2 (two) times daily for 28  days. 56 tablet 0   diazepam (VALIUM) 2 MG tablet Take one tablet by mouth 30-60 minutes prior to procedure. Do not operate heavy machinery while on this medication. 1 tablet 0   diltiazem (CARDIZEM CD) 240 MG 24 hr capsule Take 240 mg by mouth daily.     lisinopril (ZESTRIL) 10 MG tablet TAKE 1 TABLET BY MOUTH EVERY DAY 90 tablet 0   meloxicam (MOBIC) 7.5 MG tablet Take 1 tablet (7.5 mg total) by mouth daily. 30 tablet 1   omeprazole (PRILOSEC) 20 MG capsule Take 20 mg by mouth daily.     traZODone (DESYREL) 50 MG tablet Take 50 mg by mouth at bedtime.     rosuvastatin (CRESTOR) 5 MG tablet Take 1 tablet (5 mg total) by mouth daily. 90 tablet 2   No facility-administered medications prior to visit.     Review of Systems:   Constitutional:   No  weight loss, night sweats,  Fevers, chills, fatigue, or  lassitude.  HEENT:   No headaches,  Difficulty swallowing,  Tooth/dental problems, or  Sore throat,                No sneezing, itching, ear ache, nasal congestion, post nasal drip,   CV:  No chest pain,  Orthopnea, PND, swelling in lower extremities, anasarca, dizziness, palpitations, syncope.   GI  No heartburn, indigestion, abdominal pain, nausea, vomiting, diarrhea, change in bowel habits, loss of appetite, bloody  stools.   Resp: No shortness of breath with exertion or at rest.  No excess mucus, no productive cough,  No non-productive cough,  No coughing up of blood.  No change in color of mucus.  No wheezing.  No chest wall deformity  Skin: no rash or lesions.  GU: no dysuria, change in color of urine, no urgency or frequency.  No flank pain, no hematuria   MS:  No joint pain or swelling.  No decreased range of motion.  No back pain.    Physical Exam  BP 122/60 (BP Location: Left Arm, Cuff Size: Normal)   Pulse 77   Temp 97.6 F (36.4 C) (Temporal)   Ht 6\' 2"  (1.88 m)   Wt 229 lb (103.9 kg)   SpO2 95%   BMI 29.40 kg/m   GEN: A/Ox3; pleasant , NAD, well nourished     HEENT:  [place]/AT,   NOSE-clear, THROAT-clear, no lesions, no postnasal drip or exudate noted. Class 3 MP airway   NECK:  Supple w/ fair ROM; no JVD; normal carotid impulses w/o bruits; no thyromegaly or nodules palpated; no lymphadenopathy.    RESP  Clear  P & A; w/o, wheezes/ rales/ or rhonchi. no accessory muscle use, no dullness to percussion  CARD:  RRR, no m/r/g, no peripheral edema, pulses intact, no cyanosis or clubbing.  GI:   Soft & nt; nml bowel sounds; no organomegaly or masses detected.   Musco: Warm bil, no deformities or joint swelling noted.   Neuro: alert, no focal deficits noted.    Skin: Warm, no lesions or rashes    Lab Results:  CBC  BMET  BNP No results found for: "BNP"  ProBNP No results found for: "PROBNP"  Imaging: No results found.        No data to display          No results found for: "NITRICOXIDE"      Assessment & Plan:   Obstructive sleep apnea Moderate obstructive sleep apnea.  Patient has tried CPAP multiple times over the last 3 years.  Unfortunately is been unable to tolerate despite different CPAP pressure and multiple mask trials.  We discussed alternative treatment options including weight loss, positional sleep and oral appliance.  Patient is not interested in the inspire device.  Will refer to orthodontics for an oral appliance evaluation  Plan  Patient Instructions  Refer to Orthodontics for oral appliance . Dr. Myrtis Ser.  Do not drive if sleepy  Work on healthy sleep regimen  Work on healthy weight loss.  Follow up in 1 year and As needed         Rubye Oaks, NP 12/03/2022

## 2022-12-03 NOTE — Patient Instructions (Signed)
Refer to Orthodontics for oral appliance . Dr. Myrtis Ser.  Do not drive if sleepy  Work on healthy sleep regimen  Work on healthy weight loss.  Follow up in 1 year and As needed

## 2022-12-07 DIAGNOSIS — Z8249 Family history of ischemic heart disease and other diseases of the circulatory system: Secondary | ICD-10-CM | POA: Diagnosis not present

## 2022-12-07 DIAGNOSIS — I10 Essential (primary) hypertension: Secondary | ICD-10-CM | POA: Diagnosis not present

## 2022-12-07 DIAGNOSIS — G4733 Obstructive sleep apnea (adult) (pediatric): Secondary | ICD-10-CM | POA: Diagnosis not present

## 2022-12-07 DIAGNOSIS — E785 Hyperlipidemia, unspecified: Secondary | ICD-10-CM | POA: Diagnosis not present

## 2022-12-07 DIAGNOSIS — N400 Enlarged prostate without lower urinary tract symptoms: Secondary | ICD-10-CM | POA: Diagnosis not present

## 2022-12-07 DIAGNOSIS — M1990 Unspecified osteoarthritis, unspecified site: Secondary | ICD-10-CM | POA: Diagnosis not present

## 2022-12-07 DIAGNOSIS — I2510 Atherosclerotic heart disease of native coronary artery without angina pectoris: Secondary | ICD-10-CM | POA: Diagnosis not present

## 2022-12-07 DIAGNOSIS — G4700 Insomnia, unspecified: Secondary | ICD-10-CM | POA: Diagnosis not present

## 2022-12-07 DIAGNOSIS — M545 Low back pain, unspecified: Secondary | ICD-10-CM | POA: Diagnosis not present

## 2022-12-07 DIAGNOSIS — K219 Gastro-esophageal reflux disease without esophagitis: Secondary | ICD-10-CM | POA: Diagnosis not present

## 2022-12-19 DIAGNOSIS — G4733 Obstructive sleep apnea (adult) (pediatric): Secondary | ICD-10-CM | POA: Diagnosis not present

## 2022-12-31 ENCOUNTER — Ambulatory Visit
Admission: RE | Admit: 2022-12-31 | Discharge: 2022-12-31 | Disposition: A | Discharge status: Home / Self Care | Payer: 59 | Source: Ambulatory Visit | Attending: Physician Assistant | Admitting: Physician Assistant

## 2022-12-31 DIAGNOSIS — R972 Elevated prostate specific antigen [PSA]: Secondary | ICD-10-CM | POA: Diagnosis not present

## 2022-12-31 DIAGNOSIS — R300 Dysuria: Secondary | ICD-10-CM

## 2022-12-31 MED ORDER — GADOPICLENOL 0.5 MMOL/ML IV SOLN
10.0000 mL | Freq: Once | INTRAVENOUS | Status: AC | PRN
  Administered 2022-12-31: 10 mL via INTRAVENOUS

## 2023-01-01 DIAGNOSIS — G4733 Obstructive sleep apnea (adult) (pediatric): Secondary | ICD-10-CM | POA: Diagnosis not present

## 2023-01-01 DIAGNOSIS — I10 Essential (primary) hypertension: Secondary | ICD-10-CM | POA: Diagnosis not present

## 2023-01-01 DIAGNOSIS — I2510 Atherosclerotic heart disease of native coronary artery without angina pectoris: Secondary | ICD-10-CM | POA: Diagnosis not present

## 2023-01-01 DIAGNOSIS — Z7689 Persons encountering health services in other specified circumstances: Secondary | ICD-10-CM | POA: Diagnosis not present

## 2023-01-01 DIAGNOSIS — R5383 Other fatigue: Secondary | ICD-10-CM | POA: Diagnosis not present

## 2023-01-01 DIAGNOSIS — R0609 Other forms of dyspnea: Secondary | ICD-10-CM | POA: Diagnosis not present

## 2023-01-01 DIAGNOSIS — D508 Other iron deficiency anemias: Secondary | ICD-10-CM | POA: Diagnosis not present

## 2023-01-15 DIAGNOSIS — R0609 Other forms of dyspnea: Secondary | ICD-10-CM | POA: Diagnosis not present

## 2023-01-15 DIAGNOSIS — I2510 Atherosclerotic heart disease of native coronary artery without angina pectoris: Secondary | ICD-10-CM | POA: Diagnosis not present

## 2023-01-31 DIAGNOSIS — G4733 Obstructive sleep apnea (adult) (pediatric): Secondary | ICD-10-CM | POA: Diagnosis not present

## 2023-02-24 DIAGNOSIS — S335XXA Sprain of ligaments of lumbar spine, initial encounter: Secondary | ICD-10-CM | POA: Diagnosis not present

## 2023-02-24 DIAGNOSIS — M9903 Segmental and somatic dysfunction of lumbar region: Secondary | ICD-10-CM | POA: Diagnosis not present

## 2023-02-24 DIAGNOSIS — M25462 Effusion, left knee: Secondary | ICD-10-CM | POA: Diagnosis not present

## 2023-02-24 DIAGNOSIS — M25562 Pain in left knee: Secondary | ICD-10-CM | POA: Diagnosis not present

## 2023-03-03 DIAGNOSIS — G4733 Obstructive sleep apnea (adult) (pediatric): Secondary | ICD-10-CM | POA: Diagnosis not present

## 2023-04-02 DIAGNOSIS — R0609 Other forms of dyspnea: Secondary | ICD-10-CM | POA: Diagnosis not present

## 2023-04-02 DIAGNOSIS — G4733 Obstructive sleep apnea (adult) (pediatric): Secondary | ICD-10-CM | POA: Diagnosis not present

## 2023-04-02 DIAGNOSIS — E785 Hyperlipidemia, unspecified: Secondary | ICD-10-CM | POA: Diagnosis not present

## 2023-04-02 DIAGNOSIS — I10 Essential (primary) hypertension: Secondary | ICD-10-CM | POA: Diagnosis not present

## 2023-04-02 DIAGNOSIS — I2510 Atherosclerotic heart disease of native coronary artery without angina pectoris: Secondary | ICD-10-CM | POA: Diagnosis not present

## 2023-05-11 DIAGNOSIS — Z1211 Encounter for screening for malignant neoplasm of colon: Secondary | ICD-10-CM | POA: Diagnosis not present

## 2023-05-11 DIAGNOSIS — R350 Frequency of micturition: Secondary | ICD-10-CM | POA: Diagnosis not present

## 2023-05-11 DIAGNOSIS — D649 Anemia, unspecified: Secondary | ICD-10-CM | POA: Diagnosis not present

## 2023-05-24 ENCOUNTER — Encounter: Payer: Self-pay | Admitting: Internal Medicine

## 2023-05-25 ENCOUNTER — Other Ambulatory Visit: Payer: Self-pay

## 2023-05-25 ENCOUNTER — Encounter: Payer: Self-pay | Admitting: Internal Medicine

## 2023-06-01 ENCOUNTER — Encounter: Payer: Self-pay | Admitting: Internal Medicine

## 2023-06-01 ENCOUNTER — Ambulatory Visit: Payer: 59 | Admitting: Anesthesiology

## 2023-06-01 ENCOUNTER — Encounter
Admission: RE | Disposition: A | Discharge status: Home / Self Care | Payer: Self-pay | Source: Home / Self Care | Attending: Internal Medicine

## 2023-06-01 ENCOUNTER — Other Ambulatory Visit: Payer: Self-pay

## 2023-06-01 ENCOUNTER — Ambulatory Visit
Admission: RE | Admit: 2023-06-01 | Discharge: 2023-06-01 | Disposition: A | Discharge status: Home / Self Care | Payer: 59 | Attending: Internal Medicine | Admitting: Internal Medicine

## 2023-06-01 DIAGNOSIS — K219 Gastro-esophageal reflux disease without esophagitis: Secondary | ICD-10-CM | POA: Insufficient documentation

## 2023-06-01 DIAGNOSIS — G4733 Obstructive sleep apnea (adult) (pediatric): Secondary | ICD-10-CM | POA: Diagnosis not present

## 2023-06-01 DIAGNOSIS — K6289 Other specified diseases of anus and rectum: Secondary | ICD-10-CM | POA: Diagnosis not present

## 2023-06-01 DIAGNOSIS — Z1211 Encounter for screening for malignant neoplasm of colon: Secondary | ICD-10-CM | POA: Diagnosis not present

## 2023-06-01 DIAGNOSIS — K621 Rectal polyp: Secondary | ICD-10-CM | POA: Diagnosis not present

## 2023-06-01 DIAGNOSIS — K640 First degree hemorrhoids: Secondary | ICD-10-CM | POA: Diagnosis not present

## 2023-06-01 DIAGNOSIS — K649 Unspecified hemorrhoids: Secondary | ICD-10-CM | POA: Diagnosis not present

## 2023-06-01 DIAGNOSIS — I10 Essential (primary) hypertension: Secondary | ICD-10-CM | POA: Diagnosis not present

## 2023-06-01 HISTORY — PX: BIOPSY: SHX5522

## 2023-06-01 HISTORY — PX: COLONOSCOPY WITH PROPOFOL: SHX5780

## 2023-06-01 SURGERY — COLONOSCOPY WITH PROPOFOL
Anesthesia: General

## 2023-06-01 MED ORDER — SODIUM CHLORIDE 0.9 % IV SOLN: INTRAVENOUS | Status: DC

## 2023-06-01 MED ORDER — PROPOFOL 10 MG/ML IV BOLUS
INTRAVENOUS | Status: DC | PRN
  Administered 2023-06-01: 80 mg via INTRAVENOUS
  Administered 2023-06-01: 20 mg via INTRAVENOUS

## 2023-06-01 MED ORDER — PROPOFOL 500 MG/50ML IV EMUL
INTRAVENOUS | Status: DC | PRN
  Administered 2023-06-01: 150 ug/kg/min via INTRAVENOUS

## 2023-06-01 MED ORDER — LIDOCAINE HCL (CARDIAC) PF 100 MG/5ML IV SOSY
PREFILLED_SYRINGE | INTRAVENOUS | Status: DC | PRN
  Administered 2023-06-01: 50 mg via INTRAVENOUS

## 2023-06-01 MED ORDER — LIDOCAINE HCL (PF) 2 % IJ SOLN
INTRAMUSCULAR | Status: AC
  Filled 2023-06-01: qty 5

## 2023-06-01 NOTE — Op Note (Signed)
[facility] Patient Name: Howard Smith Procedure Date: 06/01/2023 10:13 AM MRN: [id number] Account #: [id number] Date of Birth: [birth year] Admit Type: Outpatient Age: 57 Room: [facility] ENDO ROOM 4 Gender: Male Note Status: Finalized Instrument Name: Nelda Marseille [id number] Procedure:             Colonoscopy Indications:           Screening for colorectal malignant neoplasm Providers:             Royce Macadamia K. Giada Schoppe MD, MD Medicines:             Propofol per Anesthesia Complications:         No immediate complications. Procedure:             Pre-Anesthesia Assessment:                        - The risks and benefits of the procedure and the                         sedation options and risks were discussed with the                         patient. All questions were answered and informed                         consent was obtained.                        - Patient identification and proposed procedure were                         verified prior to the procedure by the nurse. The                         procedure was verified in the procedure room.                        - ASA Grade Assessment: II - A patient with mild                         systemic disease.                        - After reviewing the risks and benefits, the patient                         was deemed in satisfactory condition to undergo the                         procedure.                        After obtaining informed consent, the colonoscope was                         passed under direct vision. Throughout the procedure,                         the patient's blood pressure, pulse, and oxygen  saturations were monitored continuously. The                         Colonoscope was introduced through the anus and                         advanced to the the cecum, identified by appendiceal                         orifice and ileocecal valve. The colonoscopy was                          performed without difficulty. The patient tolerated                         the procedure well. The quality of the bowel                         preparation was good. The ileocecal valve, appendiceal                         orifice, and rectum were photographed. Findings:      The perianal and digital rectal examinations were normal. Pertinent       negatives include normal sphincter tone and no palpable rectal lesions.      Non-bleeding internal hemorrhoids were found during retroflexion. The       hemorrhoids were Grade I (internal hemorrhoids that do not prolapse).      Mucosal variance was noted in the rectum as evidenced by spiculated,       hyperpigmented -appearing mucosa. Biopsies were taken with a cold       forceps for histology.      The exam was otherwise without abnormality on direct and retroflexion       views. Impression:            - Non-bleeding internal hemorrhoids.                        - The examination was otherwise normal on direct and                         retroflexion views. Recommendation:        - Patient has a contact number available for                         emergencies. The signs and symptoms of potential                         delayed complications were discussed with the patient.                         Return to normal activities tomorrow. Written                         discharge instructions were provided to the patient.                        - Resume previous diet.                        -  Continue present medications.                        - Await pathology results.                        - Repeat colonoscopy in 10 years for screening                         purposes.                        - Return to GI office PRN.                        - The findings and recommendations were discussed with                         the patient. Procedure Code(s):     --- Professional ---                        [phone], Colonoscopy, flexible;  with biopsy, single or                         multiple Diagnosis Code(s):     --- Professional ---                        K64.0, First degree hemorrhoids                        Z12.11, Encounter for screening for malignant neoplasm                         of colon CPT copyright 2022 [employer]. All rights reserved. The codes documented in this report are preliminary and upon coder review may  be revised to meet current compliance requirements. Stanton Kidney MD, MD 06/01/2023 10:42:44 AM This report has been signed electronically. Number of Addenda: 0 Note Initiated On: 06/01/2023 10:13 AM Scope Withdrawal Time: 0 hours 6 minutes 59 seconds  Total Procedure Duration: 0 hours 9 minutes 57 seconds  Estimated Blood Loss:  Estimated blood loss: none.      [facility]

## 2023-06-01 NOTE — Interval H&P Note (Signed)
History and Physical Interval Note:  06/01/2023 9:45 AM  Howard Smith.  has presented today for surgery, with the diagnosis of Z12.11 (ICD-10-CM) - Colon cancer screening.  The various methods of treatment have been discussed with the patient and family. After consideration of risks, benefits and other options for treatment, the patient has consented to  Procedure(s): COLONOSCOPY WITH PROPOFOL (N/A) as a surgical intervention.  The patient's history has been reviewed, patient examined, no change in status, stable for surgery.  I have reviewed the patient's chart and labs.  Questions were answered to the patient's satisfaction.     [place], [place]

## 2023-06-01 NOTE — Transfer of Care (Signed)
Immediate Anesthesia Transfer of Care Note  Patient: Howard Smith.  Procedure(s) Performed: COLONOSCOPY WITH PROPOFOL BIOPSY  Patient Location: Endoscopy Unit  Anesthesia Type:General  Level of Consciousness: sedated and drowsy  Airway & Oxygen Therapy: Patient Spontanous Breathing  Post-op Assessment: Report given to RN and Post -op Vital signs reviewed and stable  Post vital signs: Reviewed and stable  Last Vitals:  Vitals Value Taken Time  BP 101/80 06/01/23 1040  Temp    Pulse 75 06/01/23 1040  Resp 15 06/01/23 1040  SpO2 97 % 06/01/23 1040    Last Pain:  Vitals:   06/01/23 1040  TempSrc:   PainSc: Asleep         Complications: No notable events documented.

## 2023-06-01 NOTE — Anesthesia Procedure Notes (Signed)
Procedure Name: MAC Date/Time: 06/01/2023 10:26 AM  Performed by: Ginger Carne, CRNAPre-anesthesia Checklist: Patient identified, Emergency Drugs available, Suction available, Patient being monitored and Timeout performed Patient Re-evaluated:Patient Re-evaluated prior to induction Oxygen Delivery Method: Nasal cannula Preoxygenation: Pre-oxygenation with 100% oxygen Induction Type: IV induction

## 2023-06-01 NOTE — Anesthesia Preprocedure Evaluation (Addendum)
Anesthesia Evaluation  Patient identified by MRN, date of birth, ID band Patient awake    Reviewed: Allergy & Precautions, H&P , NPO status , Patient's Chart, lab work & pertinent test results  Airway Mallampati: II  TM Distance: >3 FB Neck ROM: full    Dental no notable dental hx.    Pulmonary sleep apnea    Pulmonary exam normal        Cardiovascular hypertension, Normal cardiovascular exam     Neuro/Psych negative neurological ROS  negative psych ROS   GI/Hepatic Neg liver ROS,GERD  Controlled and Medicated,,  Endo/Other  negative endocrine ROS    Renal/GU negative Renal ROS  negative genitourinary   Musculoskeletal   Abdominal Normal abdominal exam  (+)   Peds  Hematology negative hematology ROS (+)   Anesthesia Other Findings Past Medical History: No date: Heartburn No date: Hypertension No date: Obstructive sleep apnea  Past Surgical History: No date: none  BMI    Body Mass Index: 27.60 kg/m      Reproductive/Obstetrics negative OB ROS                             Anesthesia Physical Anesthesia Plan  ASA: 2  Anesthesia Plan: General   Post-op Pain Management:    Induction:   PONV Risk Score and Plan: Propofol infusion and TIVA  Airway Management Planned: Natural Airway  Additional Equipment:   Intra-op Plan:   Post-operative Plan:   Informed Consent: I have reviewed the patients History and Physical, chart, labs and discussed the procedure including the risks, benefits and alternatives for the proposed anesthesia with the patient or authorized representative who has indicated his/her understanding and acceptance.     Dental Advisory Given  Plan Discussed with: CRNA and Surgeon  Anesthesia Plan Comments:         Anesthesia Quick Evaluation

## 2023-06-01 NOTE — H&P (Signed)
Outpatient short stay form Pre-procedure 06/01/2023 9:44 AM Howard Smith K. Norma Fredrickson, M.D.  Primary Physician: Loura Pardon, M.D.  Reason for visit:  Colon cancer screening  History of present illness:  Patient presents for colonoscopy for colon cancer screening. The patient denies complaints of abdominal pain, significant change in bowel habits, or rectal bleeding.   Reports "normal" colonoscopy 10 years ago in [facility].  No current facility-administered medications for this encounter.  Medications Prior to Admission  Medication Sig Dispense Refill Last Dose   aspirin EC 81 MG tablet Take 1 tablet (81 mg total) by mouth daily. 90 tablet 3    diazepam (VALIUM) 2 MG tablet Take one tablet by mouth 30-60 minutes prior to procedure. Do not operate heavy machinery while on this medication. 1 tablet 0    diltiazem (CARDIZEM CD) 240 MG 24 hr capsule Take 240 mg by mouth daily.      lisinopril (ZESTRIL) 10 MG tablet TAKE 1 TABLET BY MOUTH EVERY DAY 90 tablet 0    meloxicam (MOBIC) 7.5 MG tablet Take 1 tablet (7.5 mg total) by mouth daily. 30 tablet 1    omeprazole (PRILOSEC) 20 MG capsule Take 20 mg by mouth daily.      rosuvastatin (CRESTOR) 5 MG tablet Take 1 tablet (5 mg total) by mouth daily. 90 tablet 2    traZODone (DESYREL) 50 MG tablet Take 50 mg by mouth at bedtime.        Allergies  Allergen Reactions   Bactrim [Sulfamethoxazole-Trimethoprim] Rash     Past Medical History:  Diagnosis Date   Heartburn    Hypertension    Obstructive sleep apnea     Review of systems:  Otherwise negative.    Physical Exam  Gen: Alert, oriented. Appears stated age.  HEENT: Howard Smith/AT. PERRLA. Lungs: CTA, no wheezes. CV: RR nl S1, S2. Abd: soft, benign, no masses. BS+ Ext: No edema. Pulses 2+    Planned procedures: Proceed with colonoscopy. The patient understands the nature of the planned procedure, indications, risks, alternatives and potential complications including but not limited to  bleeding, infection, perforation, damage to internal organs and possible oversedation/side effects from anesthesia. The patient agrees and gives consent to proceed.  Please refer to procedure notes for findings, recommendations and patient disposition/instructions.     [name] K. Norma Fredrickson, M.D. Gastroenterology 06/01/2023  9:44 AM

## 2023-06-01 NOTE — Anesthesia Postprocedure Evaluation (Signed)
Anesthesia Post Note  Patient: Howard Smith.  Procedure(s) Performed: COLONOSCOPY WITH PROPOFOL BIOPSY  Patient location during evaluation: Endoscopy Anesthesia Type: General Level of consciousness: awake and alert Pain management: pain level controlled Vital Signs Assessment: post-procedure vital signs reviewed and stable Respiratory status: spontaneous breathing, nonlabored ventilation and respiratory function stable Cardiovascular status: blood pressure returned to baseline and stable Postop Assessment: no apparent nausea or vomiting Anesthetic complications: no   No notable events documented.   Last Vitals:  Vitals:   06/01/23 1050 06/01/23 1100  BP:  111/82  Pulse: 71 67  Resp:  17  Temp:    SpO2: 99% 100%    Last Pain:  Vitals:   06/01/23 1100  TempSrc:   PainSc: 0-No pain                 Foye Deer

## 2023-06-02 ENCOUNTER — Encounter: Payer: Self-pay | Admitting: Internal Medicine

## 2023-06-02 LAB — SURGICAL PATHOLOGY

## 2023-06-11 DIAGNOSIS — R0609 Other forms of dyspnea: Secondary | ICD-10-CM | POA: Diagnosis not present

## 2023-06-11 DIAGNOSIS — R42 Dizziness and giddiness: Secondary | ICD-10-CM | POA: Diagnosis not present

## 2023-06-11 DIAGNOSIS — I10 Essential (primary) hypertension: Secondary | ICD-10-CM | POA: Diagnosis not present

## 2023-06-11 DIAGNOSIS — R5383 Other fatigue: Secondary | ICD-10-CM | POA: Diagnosis not present

## 2023-06-18 DIAGNOSIS — E785 Hyperlipidemia, unspecified: Secondary | ICD-10-CM | POA: Diagnosis not present

## 2023-06-18 DIAGNOSIS — G4733 Obstructive sleep apnea (adult) (pediatric): Secondary | ICD-10-CM | POA: Diagnosis not present

## 2023-06-18 DIAGNOSIS — I2510 Atherosclerotic heart disease of native coronary artery without angina pectoris: Secondary | ICD-10-CM | POA: Diagnosis not present

## 2023-06-18 DIAGNOSIS — I10 Essential (primary) hypertension: Secondary | ICD-10-CM | POA: Diagnosis not present

## 2023-06-18 DIAGNOSIS — Z125 Encounter for screening for malignant neoplasm of prostate: Secondary | ICD-10-CM | POA: Diagnosis not present

## 2023-06-18 DIAGNOSIS — K219 Gastro-esophageal reflux disease without esophagitis: Secondary | ICD-10-CM | POA: Diagnosis not present

## 2023-06-18 DIAGNOSIS — G4700 Insomnia, unspecified: Secondary | ICD-10-CM | POA: Diagnosis not present

## 2023-07-01 ENCOUNTER — Other Ambulatory Visit: Payer: Self-pay | Admitting: *Deleted

## 2023-07-01 DIAGNOSIS — R972 Elevated prostate specific antigen [PSA]: Secondary | ICD-10-CM

## 2023-07-01 NOTE — Addendum Note (Signed)
Addended by: Sueanne Margarita on: 07/01/2023 10:41 AM   Modules accepted: Orders

## 2023-07-02 ENCOUNTER — Other Ambulatory Visit: Payer: 59

## 2023-07-02 DIAGNOSIS — R972 Elevated prostate specific antigen [PSA]: Secondary | ICD-10-CM | POA: Diagnosis not present

## 2023-07-03 LAB — PSA: Prostate Specific Ag, Serum: 3.2 ng/mL (ref 0.0–4.0)

## 2023-07-06 ENCOUNTER — Ambulatory Visit: Payer: 59 | Admitting: Urology

## 2023-07-06 ENCOUNTER — Encounter: Payer: Self-pay | Admitting: Urology

## 2023-07-06 VITALS — BP 143/96 | HR 75 | Ht 68.0 in | Wt 222.2 lb

## 2023-07-06 DIAGNOSIS — R972 Elevated prostate specific antigen [PSA]: Secondary | ICD-10-CM

## 2023-07-06 DIAGNOSIS — E291 Testicular hypofunction: Secondary | ICD-10-CM | POA: Diagnosis not present

## 2023-07-06 DIAGNOSIS — R350 Frequency of micturition: Secondary | ICD-10-CM | POA: Diagnosis not present

## 2023-07-06 NOTE — Progress Notes (Signed)
@ENCDATE @ 10:11 AM   Howard Smith. 1965/08/18 [id number]  Referring provider: No referring provider defined for this encounter.  Chief Complaint  Patient presents with   Elevated PSA    HPI: 57 year-old male with a personal history of hypogonadism, chronic prostatitis, and elevated PSA presents today for a follow-up.  His symptoms were initially likely exacerbated/ triggered by testosterone therapy. At hat time his PSA had risen to 12.5 and he also developed irritated urinary symptoms. He was treated with Flomax and Meloxicam but he failed this, and then was placed on a four week course of empiric antibiotics in the form of Bactrim.   He had a prostate MRI on 12/31/2022 that showed hazy T2 streaks in the peripheral zone felt to be probably post-inflammatory PI-RADS 2. His prostate was only 23.7 grams.   His most recent PSA on 07/02/23 continues to trend downwards at 3.2.   He mentions having urinary frequency still. He has nocturia but not as bad he did before.   Denies burning with urination. He avoids coffee, tea, and alcohol.   He is taking Flomax and no longer taking testosterone. He feels it is low again because he has very low energy though and would like it checked.   PSA Trend: 11/03/2018       1.15 05/12/2021   1.22 05/13/2022   9.56 06/08/2022   9.4 07/23/2022   6.1 10/27/2022       5.1 07/02/2023     3.2  PMH: Past Medical History:  Diagnosis Date   Heartburn    Hypertension    Obstructive sleep apnea     Surgical History: Past Surgical History:  Procedure Laterality Date   BIOPSY  06/01/2023   Procedure: BIOPSY;  Surgeon: Norma Fredrickson, Boykin Nearing, MD;  Location: [facility] ENDOSCOPY;  Service: Gastroenterology;;   COLONOSCOPY WITH PROPOFOL N/A 06/01/2023   Procedure: COLONOSCOPY WITH PROPOFOL;  Surgeon: Toledo, Boykin Nearing, MD;  Location: [facility] ENDOSCOPY;  Service: Gastroenterology;  Laterality: N/A;   none      Home Medications:  Allergies as of 07/06/2023        Reactions   Bactrim [sulfamethoxazole-trimethoprim] Rash        Medication List        Accurate as of July 06, 2023 10:11 AM. If you have any questions, ask your nurse or doctor.          aspirin EC 81 MG tablet Take 1 tablet (81 mg total) by mouth daily.   diazepam 2 MG tablet Commonly known as: Valium Take one tablet by mouth 30-60 minutes prior to procedure. Do not operate heavy machinery while on this medication.   diltiazem 240 MG 24 hr capsule Commonly known as: CARDIZEM CD Take 240 mg by mouth daily.   lisinopril 10 MG tablet Commonly known as: ZESTRIL TAKE 1 TABLET BY MOUTH EVERY DAY   meloxicam 7.5 MG tablet Commonly known as: Mobic Take 1 tablet (7.5 mg total) by mouth daily.   omeprazole 20 MG capsule Commonly known as: PRILOSEC Take 20 mg by mouth daily.   rosuvastatin 5 MG tablet Commonly known as: CRESTOR Take 1 tablet (5 mg total) by mouth daily.   traZODone 50 MG tablet Commonly known as: DESYREL Take 50 mg by mouth at bedtime.        Allergies:  Allergies  Allergen Reactions   Bactrim [Sulfamethoxazole-Trimethoprim] Rash    Family History: Family History  Problem Relation Age of Onset   Cancer - Lung Father  Heart failure Father    Heart attack Father 40   Bipolar disorder Mother    Hyperlipidemia Mother    Hypertension Mother    Breast cancer Maternal Grandmother     Social History:  reports that he has never smoked. He has never used smokeless tobacco. He reports that he does not drink alcohol and does not use drugs.   Physical Exam: BP (!) 143/96   Pulse 75   Ht 5\' 8"  (1.727 m)   Wt 222 lb 3.2 oz (100.8 kg)   BMI 33.79 kg/m   Constitutional:  Alert and oriented, No acute distress. HEENT: [place] AT, moist mucus membranes.  Trachea midline, no masses. Neurologic: Grossly intact, no focal deficits, moving all 4 extremities. Psychiatric: Normal mood and affect.   Pertinent Imaging: CLINICAL DATA:  Elevated PSA for  1 year. PSA level 5.1 on 10/27/2022. History of prostatitis.   EXAM: MR PROSTATE WITHOUT AND WITH CONTRAST   TECHNIQUE: Multiplanar multisequence MRI images were obtained of the pelvis centered about the prostate. Pre and post contrast images were obtained.   CONTRAST:  10 cc Vueway   COMPARISON:  None Available.   FINDINGS: Prostate:   Hazy and streaky low T2 signal in the peripheral zone is nonfocal, likely postinflammatory, and is considered PI-RADS category 2.   No restriction of diffusion. No significant abnormal early focal enhancement in the peripheral zone. No focal lesion of intermediate or higher suspicion for prostate cancer is identified.   Volume: 3D volumetric analysis: Prostate volume 23.76 cc (4.1 by 3.3 by 3.8 cm).   Transcapsular spread:  Absent   Seminal vesicle involvement: Absent   Neurovascular bundle involvement: Absent   Pelvic adenopathy: Absent   Bone metastasis: Absent   Other findings: No other significant findings.   IMPRESSION: 1. No focal lesion of intermediate or higher suspicion for prostate cancer is identified. 2. Hazy and streaky low T2 signal in the peripheral zone is nonfocal, likely postinflammatory, and is considered PI-RADS category 2.   Electronically Signed   By: Gaylyn Rong M.D.   On: 12/31/2022 14:17 Personally reviewed the above scan and agree with radiologic interpretation.   Assessment & Plan:    1. Hypogonadism  -  He's reportedly still symptomatic from this, primarily energy-related issues. No sexual side effects. Testosterone hasn't been checked in over a year. Will check and see if it's still low. Hesitate to put him back on replacement therapy in light of his severe prostatitis type symptoms. - consider Clomid with very careful monitoring.  Risk and benefits discussed.  If his testosterone remains low, he is willing to try this.  Will plan for surveillance labs in 3 months.  2. Elevated PSA  - PSA  is turning back downwards. MRI was reassuring and showed post-inflammatory changes, which likely accounts for his elevated PSA. Will repeat in a year.   3. Urinary frequency  - Explained this can be due to the bladder recovering from being inflamed. Recommended avoiding irritants such as coffee, tea, and alcohol. On Flomax and not interested in pursuing other medications at this time. Symptoms likely to improve with time.  Return in about 1 year (around 07/05/2024) for PSA, DRE, IPSS, PVR.  I have reviewed the above documentation for accuracy and completeness, and I agree with the above.   Vanna Scotland, MD   [facility] [address] [place], [place] [address] [phone]

## 2023-07-07 LAB — TESTOSTERONE: Testosterone: 289 ng/dL (ref 264–916)

## 2023-07-09 DIAGNOSIS — R0609 Other forms of dyspnea: Secondary | ICD-10-CM | POA: Diagnosis not present

## 2023-07-10 ENCOUNTER — Other Ambulatory Visit: Payer: Self-pay | Admitting: Urology

## 2023-08-05 DIAGNOSIS — E785 Hyperlipidemia, unspecified: Secondary | ICD-10-CM | POA: Diagnosis not present

## 2023-08-05 DIAGNOSIS — I2510 Atherosclerotic heart disease of native coronary artery without angina pectoris: Secondary | ICD-10-CM | POA: Diagnosis not present

## 2023-08-05 DIAGNOSIS — I10 Essential (primary) hypertension: Secondary | ICD-10-CM | POA: Diagnosis not present

## 2024-01-04 DIAGNOSIS — H47323 Drusen of optic disc, bilateral: Secondary | ICD-10-CM | POA: Diagnosis not present

## 2024-02-15 DIAGNOSIS — E291 Testicular hypofunction: Secondary | ICD-10-CM | POA: Diagnosis not present

## 2024-02-21 ENCOUNTER — Encounter: Payer: Self-pay | Admitting: Internal Medicine

## 2024-02-22 DIAGNOSIS — G4700 Insomnia, unspecified: Secondary | ICD-10-CM | POA: Diagnosis not present

## 2024-02-22 DIAGNOSIS — I2510 Atherosclerotic heart disease of native coronary artery without angina pectoris: Secondary | ICD-10-CM | POA: Diagnosis not present

## 2024-02-22 DIAGNOSIS — E785 Hyperlipidemia, unspecified: Secondary | ICD-10-CM | POA: Diagnosis not present

## 2024-02-22 DIAGNOSIS — I10 Essential (primary) hypertension: Secondary | ICD-10-CM | POA: Diagnosis not present

## 2024-02-22 DIAGNOSIS — N400 Enlarged prostate without lower urinary tract symptoms: Secondary | ICD-10-CM | POA: Diagnosis not present

## 2024-02-22 DIAGNOSIS — Z8249 Family history of ischemic heart disease and other diseases of the circulatory system: Secondary | ICD-10-CM | POA: Diagnosis not present

## 2024-02-22 DIAGNOSIS — M1990 Unspecified osteoarthritis, unspecified site: Secondary | ICD-10-CM | POA: Diagnosis not present

## 2024-03-02 DIAGNOSIS — M79642 Pain in left hand: Secondary | ICD-10-CM | POA: Diagnosis not present

## 2024-03-02 DIAGNOSIS — I10 Essential (primary) hypertension: Secondary | ICD-10-CM | POA: Diagnosis not present

## 2024-03-02 DIAGNOSIS — M79641 Pain in right hand: Secondary | ICD-10-CM | POA: Diagnosis not present

## 2024-03-10 ENCOUNTER — Ambulatory Visit

## 2024-03-13 DIAGNOSIS — M79641 Pain in right hand: Secondary | ICD-10-CM | POA: Diagnosis not present

## 2024-03-13 DIAGNOSIS — M79642 Pain in left hand: Secondary | ICD-10-CM | POA: Diagnosis not present

## 2024-03-13 DIAGNOSIS — I10 Essential (primary) hypertension: Secondary | ICD-10-CM | POA: Diagnosis not present

## 2024-03-28 NOTE — Progress Notes (Unsigned)
  Cardiology Office Note   Date:  03/30/2024  ID:  [name]., DOB [birth year], MRN [id number] PCP: Maryjane Public, MD (Inactive)  [facility] Providers Cardiologist:  Caron Poser, MD     History of Present Illness Howard Smith. is a 58 y.o. male PMH HTN, HLD, obesity, nonobstructive CAD who presents to establish care for ongoing management of his CAD.  Patient reports he is overall doing well and has no complaints today.  Denies any angina, orthopnea, syncope, palpitations or any other significant cardiovascular symptoms.  He has had relatively recent cardiovascular testing which was all reassuring.  Last LDL 64 12/2022.  Relevant CVD History -Normal stress PET with normal MBFR 03/2023 ([facility]) -Normal LVEF, trivial TR TTE 12/2022 ([facility]) -Invasive angiogram 06/2021 40% mid LAD, 20% proximal RCA   ROS: Pt denies any chest discomfort, jaw pain, arm pain, palpitations, syncope, presyncope, orthopnea, PND, or LE edema.  Studies Reviewed I have independently reviewed the patient's ECG, previous cardiac testing, and recent blood work.  Physical Exam VS:  BP 108/78 (BP Location: Right Arm, Patient Position: Sitting, Cuff Size: Large)   Pulse 76   Ht 6' 2 (1.88 m)   Wt 221 lb (100.2 kg)   SpO2 96%   BMI 28.37 kg/m        Wt Readings from Last 3 Encounters:  03/30/24 221 lb (100.2 kg)  07/06/23 222 lb 3.2 oz (100.8 kg)  06/01/23 213 lb (96.6 kg)    GEN: No acute distress. NECK: No JVD; No carotid bruits. CARDIAC: RRR, no murmurs, rubs, gallops. RESPIRATORY:  Clear to auscultation. EXTREMITIES:  Warm and well-perfused. No edema.  ASSESSMENT AND PLAN Nonobstructive CAD Patient presents for follow-up of his CAD which was previously nonobstructive from May 2022 invasive angiogram.  He has a normal stress PET from [facility] within the last year.  He is on good medical therapy.  He is asymptomatic with good functional status.  Plan: - Continue ASA 81 mg daily -  Continue Crestor  5 mg daily - Recheck lipids today; LDL goal less than 70  HLD Last LDL 64 12/2022.  LDL goal less than 70.  Continue Crestor  5 mg daily for now.  Will recheck lipids today.  HTN Well-controlled.  Continue HCTZ 12.5 mg daily and losartan 25 mg daily.        Dispo: RTC 1 year or sooner as needed  Signed, Caron Poser, MD

## 2024-03-30 ENCOUNTER — Ambulatory Visit

## 2024-03-30 VITALS — BP 108/78 | HR 76 | Ht 74.0 in | Wt 221.0 lb

## 2024-03-30 DIAGNOSIS — E782 Mixed hyperlipidemia: Secondary | ICD-10-CM | POA: Diagnosis not present

## 2024-03-30 DIAGNOSIS — I10 Essential (primary) hypertension: Secondary | ICD-10-CM | POA: Diagnosis not present

## 2024-03-30 DIAGNOSIS — I2510 Atherosclerotic heart disease of native coronary artery without angina pectoris: Secondary | ICD-10-CM | POA: Diagnosis not present

## 2024-03-30 NOTE — Patient Instructions (Addendum)
 Medication Instructions:  Your physician recommends that you continue on your current medications as directed. Please refer to the Current Medication list given to you today.  *If you need a refill on your cardiac medications before your next appointment, please call your pharmacy*  Lab Work:  Your provider would like for you to have following labs drawn today LIPID PANEL.     If you have labs (blood work) drawn today and your tests are completely normal, you will receive your results only by: MyChart Message (if you have MyChart) OR A paper copy in the mail If you have any lab test that is abnormal or we need to change your treatment, we will call you to review the results.  Testing/Procedures:  No test ordered today   Follow-Up: At [facility], you and your health needs are our priority.  As part of our continuing mission to provide you with exceptional heart care, our providers are all part of one team.  This team includes your primary Cardiologist (physician) and Advanced Practice Providers or APPs (Physician Assistants and Nurse Practitioners) who all work together to provide you with the care you need, when you need it.  Your next appointment:   12 month(s)  Provider:   You may see Caron Poser, MD  We recommend signing up for the patient portal called MyChart.  Sign up information is provided on this After Visit Summary.  MyChart is used to connect with patients for Virtual Visits (Telemedicine).  Patients are able to view lab/test results, encounter notes, upcoming appointments, etc.  Non-urgent messages can be sent to your provider as well.   To learn more about what you can do with MyChart, go to [email or web address].

## 2024-03-31 ENCOUNTER — Ambulatory Visit: Payer: Self-pay

## 2024-03-31 LAB — LIPID PANEL
Chol/HDL Ratio: 2.6 ratio (ref 0.0–5.0)
Cholesterol, Total: 122 mg/dL (ref 100–199)
HDL: 47 mg/dL (ref >39)
LDL Chol Calc (NIH): 61 mg/dL (ref 0–99)
Triglycerides: 70 mg/dL (ref 0–149)
VLDL Cholesterol Cal: 14 mg/dL (ref 5–40)

## 2024-04-03 DIAGNOSIS — I10 Essential (primary) hypertension: Secondary | ICD-10-CM | POA: Diagnosis not present

## 2024-04-04 DIAGNOSIS — Z23 Encounter for immunization: Secondary | ICD-10-CM | POA: Diagnosis not present

## 2024-04-04 DIAGNOSIS — M79642 Pain in left hand: Secondary | ICD-10-CM | POA: Diagnosis not present

## 2024-04-04 DIAGNOSIS — M79641 Pain in right hand: Secondary | ICD-10-CM | POA: Diagnosis not present

## 2024-04-04 DIAGNOSIS — I10 Essential (primary) hypertension: Secondary | ICD-10-CM | POA: Diagnosis not present

## 2024-06-05 DIAGNOSIS — M25551 Pain in right hip: Secondary | ICD-10-CM | POA: Diagnosis not present

## 2024-06-05 DIAGNOSIS — M9905 Segmental and somatic dysfunction of pelvic region: Secondary | ICD-10-CM | POA: Diagnosis not present

## 2024-06-05 DIAGNOSIS — M9903 Segmental and somatic dysfunction of lumbar region: Secondary | ICD-10-CM | POA: Diagnosis not present

## 2024-06-05 DIAGNOSIS — M9904 Segmental and somatic dysfunction of sacral region: Secondary | ICD-10-CM | POA: Diagnosis not present

## 2024-06-12 DIAGNOSIS — I10 Essential (primary) hypertension: Secondary | ICD-10-CM | POA: Diagnosis not present

## 2024-06-12 DIAGNOSIS — Z125 Encounter for screening for malignant neoplasm of prostate: Secondary | ICD-10-CM | POA: Diagnosis not present

## 2024-06-20 DIAGNOSIS — I2510 Atherosclerotic heart disease of native coronary artery without angina pectoris: Secondary | ICD-10-CM | POA: Diagnosis not present

## 2024-06-20 DIAGNOSIS — I10 Essential (primary) hypertension: Secondary | ICD-10-CM | POA: Diagnosis not present

## 2024-06-20 DIAGNOSIS — Z1331 Encounter for screening for depression: Secondary | ICD-10-CM | POA: Diagnosis not present

## 2024-06-20 DIAGNOSIS — Z0000 Encounter for general adult medical examination without abnormal findings: Secondary | ICD-10-CM | POA: Diagnosis not present

## 2024-06-26 DIAGNOSIS — M25551 Pain in right hip: Secondary | ICD-10-CM | POA: Diagnosis not present

## 2024-06-26 DIAGNOSIS — M9903 Segmental and somatic dysfunction of lumbar region: Secondary | ICD-10-CM | POA: Diagnosis not present

## 2024-06-26 DIAGNOSIS — M9905 Segmental and somatic dysfunction of pelvic region: Secondary | ICD-10-CM | POA: Diagnosis not present

## 2024-06-26 DIAGNOSIS — M9904 Segmental and somatic dysfunction of sacral region: Secondary | ICD-10-CM | POA: Diagnosis not present

## 2024-07-05 ENCOUNTER — Ambulatory Visit: Payer: Self-pay | Admitting: Urology

## 2024-07-06 DIAGNOSIS — R972 Elevated prostate specific antigen [PSA]: Secondary | ICD-10-CM | POA: Insufficient documentation

## 2024-07-06 NOTE — Assessment & Plan Note (Deleted)
 Hx of prior elevated PSA (1-9 since 2020)   - hx of chronic prostatitis  - negative MRI in 2024  - PSA 2.15 (Nov 2025)   Reviewed his clinical history and PSA data over the last couple years.  Most recent PSA of 2.15 is very reassuring.  Recommend continuation of annual PSA screening-would maintain a bit higher threshold to repeat workup in the future, unless sustained PSA elevation off prior baseline.   - PSA in 1 year

## 2024-07-06 NOTE — Progress Notes (Deleted)
° °  07/07/2024 6:30 AM   Howard Smith. Jul 23, 1966 [id number]  Reason for visit: Follow up elevated PSA, BPH   HPI: 57 y.o. male, initial follow up with me today, previously seen by Dr. Penne in Dec 2024  Prior HPI: Hx of chronic prostatitis  Hx of elevated PSA  - MRI (June 2024) - 24g, no > PIRADS 3  - PSA 3.2 (Dec 2024)  Hx of low T  - discontinued prior TRT  Hx of BPH  - on Flomax     Physical Exam: There were no vitals taken for this visit.   Constitutional:  Alert and oriented, No acute distress.  Laboratory Data: 11/03/2018       1.15 05/12/2021   1.22 05/13/2022   9.56 06/08/2022   9.4 07/23/2022   6.1 10/27/2022       5.1 07/02/2023     3.2 06/12/2024    2.15  Component Ref Range & Units (hover) 1 yr ago  Testosterone  289    Pertinent Imaging: N/A    Assessment & Plan:    Elevated PSA Assessment & Plan: Hx of prior elevated PSA (1-9 since 2020)   - hx of chronic prostatitis  - negative MRI in 2024  - PSA 2.15 (Nov 2025)   Reviewed his clinical history and PSA data over the last couple years.  Most recent PSA of 2.15 is very reassuring.  Recommend continuation of annual PSA screening-would maintain a bit higher threshold to repeat workup in the future, unless sustained PSA elevation off prior baseline.   - PSA in 1 year        Penne JONELLE Skye, MD  [facility] [address] [place], [place] [address] [phone]

## 2024-07-07 ENCOUNTER — Ambulatory Visit: Admitting: Urology
